# Patient Record
Sex: Male | Born: 1955
Health system: Southern US, Community
[De-identification: ages and names within clinical notes are randomized; demographics above are authoritative.]

## PROBLEM LIST (undated history)

## (undated) DIAGNOSIS — IMO0001 Reserved for inherently not codable concepts without codable children: Secondary | ICD-10-CM

## (undated) DIAGNOSIS — R5383 Other fatigue: Secondary | ICD-10-CM

## (undated) DIAGNOSIS — R0602 Shortness of breath: Secondary | ICD-10-CM

## (undated) DIAGNOSIS — I340 Nonrheumatic mitral (valve) insufficiency: Secondary | ICD-10-CM

## (undated) DIAGNOSIS — I071 Rheumatic tricuspid insufficiency: Secondary | ICD-10-CM

## (undated) DIAGNOSIS — I5189 Other ill-defined heart diseases: Secondary | ICD-10-CM

## (undated) DIAGNOSIS — I509 Heart failure, unspecified: Secondary | ICD-10-CM

## (undated) DIAGNOSIS — I10 Essential (primary) hypertension: Secondary | ICD-10-CM

## (undated) DIAGNOSIS — Z720 Tobacco use: Secondary | ICD-10-CM

## (undated) DIAGNOSIS — J189 Pneumonia, unspecified organism: Secondary | ICD-10-CM

## (undated) DIAGNOSIS — I272 Pulmonary hypertension, unspecified: Secondary | ICD-10-CM

## (undated) DIAGNOSIS — R600 Localized edema: Secondary | ICD-10-CM

## (undated) DIAGNOSIS — I5022 Chronic systolic (congestive) heart failure: Secondary | ICD-10-CM

## (undated) DIAGNOSIS — I519 Heart disease, unspecified: Secondary | ICD-10-CM

## (undated) DIAGNOSIS — F17200 Nicotine dependence, unspecified, uncomplicated: Secondary | ICD-10-CM

## (undated) HISTORY — PX: LEG SURGERY: SHX1003

---

## 1983-01-18 HISTORY — PX: OTHER SURGICAL HISTORY: SHX169

## 2014-12-18 DIAGNOSIS — I071 Rheumatic tricuspid insufficiency: Secondary | ICD-10-CM

## 2014-12-18 DIAGNOSIS — I340 Nonrheumatic mitral (valve) insufficiency: Secondary | ICD-10-CM

## 2014-12-18 DIAGNOSIS — I272 Pulmonary hypertension, unspecified: Secondary | ICD-10-CM

## 2014-12-18 DIAGNOSIS — I5022 Chronic systolic (congestive) heart failure: Secondary | ICD-10-CM

## 2014-12-18 HISTORY — DX: Rheumatic tricuspid insufficiency: I07.1

## 2014-12-18 HISTORY — DX: Chronic systolic (congestive) heart failure: I50.22

## 2014-12-18 HISTORY — DX: Pulmonary hypertension, unspecified: I27.20

## 2014-12-18 HISTORY — DX: Nonrheumatic mitral (valve) insufficiency: I34.0

## 2015-01-29 ENCOUNTER — Other Ambulatory Visit (HOSPITAL_COMMUNITY): Payer: Self-pay

## 2015-01-29 ENCOUNTER — Other Ambulatory Visit (HOSPITAL_COMMUNITY): Payer: Self-pay | Admitting: Cardiology

## 2015-01-29 ENCOUNTER — Encounter (HOSPITAL_COMMUNITY): Payer: Self-pay

## 2015-01-29 ENCOUNTER — Ambulatory Visit (HOSPITAL_COMMUNITY)
Admission: RE | Admit: 2015-01-29 | Discharge: 2015-01-29 | Disposition: A | Discharge status: Home / Self Care | Payer: 59 | Source: Ambulatory Visit | Attending: Internal Medicine | Admitting: Internal Medicine

## 2015-01-29 VITALS — BP 132/82 | HR 86 | Wt 146.5 lb

## 2015-01-29 DIAGNOSIS — F17210 Nicotine dependence, cigarettes, uncomplicated: Secondary | ICD-10-CM | POA: Insufficient documentation

## 2015-01-29 DIAGNOSIS — I110 Hypertensive heart disease with heart failure: Secondary | ICD-10-CM | POA: Insufficient documentation

## 2015-01-29 DIAGNOSIS — Z720 Tobacco use: Secondary | ICD-10-CM | POA: Diagnosis not present

## 2015-01-29 DIAGNOSIS — I5022 Chronic systolic (congestive) heart failure: Secondary | ICD-10-CM

## 2015-01-29 DIAGNOSIS — E785 Hyperlipidemia, unspecified: Secondary | ICD-10-CM | POA: Diagnosis not present

## 2015-01-29 DIAGNOSIS — I10 Essential (primary) hypertension: Secondary | ICD-10-CM | POA: Insufficient documentation

## 2015-01-29 DIAGNOSIS — Z79899 Other long term (current) drug therapy: Secondary | ICD-10-CM | POA: Insufficient documentation

## 2015-01-29 DIAGNOSIS — F17200 Nicotine dependence, unspecified, uncomplicated: Secondary | ICD-10-CM

## 2015-01-29 DIAGNOSIS — I429 Cardiomyopathy, unspecified: Secondary | ICD-10-CM | POA: Diagnosis present

## 2015-01-29 HISTORY — DX: Rheumatic tricuspid insufficiency: I07.1

## 2015-01-29 HISTORY — DX: Shortness of breath: R06.02

## 2015-01-29 HISTORY — DX: Reserved for inherently not codable concepts without codable children: IMO0001

## 2015-01-29 HISTORY — DX: Nonrheumatic mitral (valve) insufficiency: I34.0

## 2015-01-29 HISTORY — DX: Pneumonia, unspecified organism: J18.9

## 2015-01-29 HISTORY — DX: Other fatigue: R53.83

## 2015-01-29 HISTORY — DX: Localized edema: R60.0

## 2015-01-29 HISTORY — DX: Tobacco use: Z72.0

## 2015-01-29 HISTORY — DX: Pulmonary hypertension, unspecified: I27.20

## 2015-01-29 HISTORY — DX: Chronic systolic (congestive) heart failure: I50.22

## 2015-01-29 HISTORY — DX: Nicotine dependence, unspecified, uncomplicated: F17.200

## 2015-01-29 HISTORY — DX: Other ill-defined heart diseases: I51.89

## 2015-01-29 HISTORY — DX: Heart disease, unspecified: I51.9

## 2015-01-29 HISTORY — DX: Essential (primary) hypertension: I10

## 2015-01-29 MED ORDER — FUROSEMIDE 40 MG PO TABS: 40.0000 mg | ORAL_TABLET | Freq: Every day | ORAL | Status: DC

## 2015-01-29 MED ORDER — POTASSIUM CHLORIDE CRYS ER 20 MEQ PO TBCR: 20.0000 meq | EXTENDED_RELEASE_TABLET | Freq: Every day | ORAL | Status: DC

## 2015-01-29 MED ORDER — SPIRONOLACTONE 25 MG PO TABS: 12.5000 mg | ORAL_TABLET | Freq: Every day | ORAL | Status: DC

## 2015-01-29 NOTE — Progress Notes (Signed)
Patient ID: Carlos [name], male   DOB: 07/16/1955, 60 y.o.   MRN: [id number] PCP: Dr. Venetia Maxon [facility]) Cardiology: Dr. Aundra Dubin  60 yo with minimal past history presents for evaluation of newly-found cardiomyopathy.  Patient had minimal medical care up until December.  He had not seen a doctor for years.  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  Around Thanksgiving of 2016, he developed a cough and exertional shortness of breath.  He went to an urgent care and had a CXR that was suggestive of PNA, also showed cardiomegaly.  He did not improve with antibiotics.  Dyspnea became progressively worse and he developed ankle edema.  At this point, he went to see Dr Venetia Maxon in [place].  He was found to be hypertensive and was started on Coreg and losartan.  Atorvastatin was started for elevated lipids.  He was sent for an echo at the hospital in [place], [place] was 25-30%. He was therefore referred here.   Currently, patient is short of breath walking 3/10 of a mile to his mailbox.  A couple of mos ago, he was able to climb up [facility] without problems.  Now, any incline gets him out of breath.  No orthopnea/PND.  He has occasional dull right-sided chest pain but nothing exertional.  Poor appetite and early satiety.  He continues to smoke about 2 cigs/day.   He has noted increased fatigue since last summer.   ECG: NSR, nonspecific T wave flattening.   Labs (12/16): K 4.5, creatinine 1.21, LFTs normal  PMH: 1. Hyperlipidemia 2. HTN 3. Active smoker.  4. Chronic systolic CHF: Echo (Q000111Q) with EF 25-30%, restrictive diastolic function, mildly enlarged RV with mildly decreased systolic function, moderate MR, moderate TR, PASP 67 mmHg.   SH: Lives in [place] (near [place]), smokes < 1 ppd, no ETOH, drives garbage truck.   FH: No premature CAD, no cardiomyopathy.   ROS: All systems reviewed and negative  except as per HPI.   Current Outpatient Prescriptions  Medication Sig Dispense Refill  . atorvastatin (LIPITOR) 40 MG tablet Take 40 mg by mouth daily.    . carvedilol (COREG) 6.25 MG tablet Take 6.25 mg by mouth 2 (two) times daily with a meal.    . furosemide (LASIX) 40 MG tablet Take 1 tablet (40 mg total) by mouth daily. 30 tablet 6  . losartan (COZAAR) 100 MG tablet Take 100 mg by mouth daily.    . potassium chloride SA (KLOR-CON M20) 20 MEQ tablet Take 1 tablet (20 mEq total) by mouth daily. 30 tablet 6  . spironolactone (ALDACTONE) 25 MG tablet Take 0.5 tablets (12.5 mg total) by mouth daily. 15 tablet 6   No current facility-administered medications for this encounter.   BP 132/82 mmHg  Pulse 86  Wt 146 lb 8 oz (66.452 kg)  SpO2 99% General: NAD Neck: JVP 14-16 cm, no thyromegaly or thyroid nodule.  Lungs: Clear to auscultation bilaterally with normal respiratory effort. CV: Nondisplaced PMI.  Heart regular S1/S2, no S3/S4, 1/6 HSM LLSB/apex. 1+ edema 1/2 to knees bilaterally.  No carotid bruit.  Normal pedal pulses.  Abdomen: Soft, nontender, no hepatosplenomegaly, no distention.  Skin: Intact without lesions or rashes.  Neurologic: Alert and oriented x 3.  Psych: Normal affect. Extremities: No clubbing or cyanosis.  HEENT: Normal.   Assessment/Plan: 1. Chronic systolic CHF: Cardiomyopathy  of uncertain etiology, EF 25-30% on 12/16 echo.  ECG does not show definite prior MI.  However, he certainly has RFs for coronary disease (untreated HTN and hyperlipidemia, smoking).  Possible viral myocarditis: episode seemed to start with an infectious event (possible PNA).  Also, must consider hypertensive cardiomyopathy with possibly years of untreated hypertension.  However, he does not have much LVH on his ECG.  On exam today, he is very volume overloaded.  NYHA class III symptoms.   - He needs diuresis: will start Lasix 40 mg daily along with KCl 20 daily.  - Continue current Coreg  and losartan, eventually transition over to [place].  - I will add spironolactone 12.5 daily today.  - BMET/BNP today and repeat in 2 wks. - I will arrange for LHC/RHC to assess cardiac output and assess for CAD as a cause of his cardiomyopathy.  We discussed risks/benefits of the procedure and he agrees to go forward with it.  We will arrange for this next week . - If no significant coronary disease on cath, will arrange for cardiac MRI to assess for infiltrative disease.  - I recommended that he start ASA 81, however he is concerned about prior excessive bruising on ASA.  If he has significant coronary disease on cath, he will need to start ASA.  2. HTN: BP controlled on current regimen.  3. Hyperlipidemia: He recently started on atorvastatin.  4. Smoking: I strongly encouraged him to quit smoking.   [name] 01/29/2015

## 2015-01-29 NOTE — Patient Instructions (Signed)
START Lasix 40 mg tablet once daily.  START Potassium 20 meq tablet once daily.  START Spironolactone 12.5 mg (1/2 tablet) once daily.  You have been scheduled for a heart catheterization. Please see instruction sheet for additional details.  Follow up 3 weeks.  Do the following things EVERYDAY: 1) Weigh yourself in the morning before breakfast. Write it down and keep it in a log. 2) Take your medicines as prescribed 3) Eat low salt foods-Limit salt (sodium) to 2000 mg per day.  4) Stay as active as you can everyday 5) Limit all fluids for the day to less than 2 liters

## 2015-02-02 ENCOUNTER — Ambulatory Visit (HOSPITAL_COMMUNITY)
Admission: RE | Admit: 2015-02-02 | Discharge: 2015-02-02 | Disposition: A | Discharge status: Home / Self Care | Payer: 59 | Source: Ambulatory Visit | Attending: Cardiology | Admitting: Cardiology

## 2015-02-02 ENCOUNTER — Telehealth (HOSPITAL_COMMUNITY): Payer: Self-pay | Admitting: *Deleted

## 2015-02-02 ENCOUNTER — Encounter (HOSPITAL_COMMUNITY)
Admission: RE | Disposition: A | Discharge status: Home / Self Care | Payer: 59 | Source: Ambulatory Visit | Attending: Cardiology

## 2015-02-02 DIAGNOSIS — I110 Hypertensive heart disease with heart failure: Secondary | ICD-10-CM | POA: Insufficient documentation

## 2015-02-02 DIAGNOSIS — I5022 Chronic systolic (congestive) heart failure: Secondary | ICD-10-CM

## 2015-02-02 DIAGNOSIS — F17210 Nicotine dependence, cigarettes, uncomplicated: Secondary | ICD-10-CM | POA: Insufficient documentation

## 2015-02-02 DIAGNOSIS — I429 Cardiomyopathy, unspecified: Secondary | ICD-10-CM | POA: Insufficient documentation

## 2015-02-02 DIAGNOSIS — I272 Other secondary pulmonary hypertension: Secondary | ICD-10-CM | POA: Diagnosis not present

## 2015-02-02 DIAGNOSIS — E785 Hyperlipidemia, unspecified: Secondary | ICD-10-CM | POA: Insufficient documentation

## 2015-02-02 DIAGNOSIS — I2510 Atherosclerotic heart disease of native coronary artery without angina pectoris: Secondary | ICD-10-CM | POA: Diagnosis not present

## 2015-02-02 HISTORY — PX: CARDIAC CATHETERIZATION: SHX172

## 2015-02-02 LAB — POCT I-STAT 3, VENOUS BLOOD GAS (G3P V)
Acid-Base Excess: 3 mmol/L — ABNORMAL HIGH (ref 0.0–2.0)
BICARBONATE: 26.2 meq/L — AB (ref 20.0–24.0)
BICARBONATE: 28.5 meq/L — AB (ref 20.0–24.0)
O2 SAT: 61 %
O2 Saturation: 66 %
PCO2 VEN: 47.6 mmHg (ref 45.0–50.0)
PO2 VEN: 36 mmHg (ref 30.0–45.0)
TCO2: 28 mmol/L (ref 0–100)
TCO2: 30 mmol/L (ref 0–100)
pCO2, Ven: 46.1 mmHg (ref 45.0–50.0)
pH, Ven: 7.362 — ABNORMAL HIGH (ref 7.250–7.300)
pH, Ven: 7.385 — ABNORMAL HIGH (ref 7.250–7.300)
pO2, Ven: 33 mmHg (ref 30.0–45.0)

## 2015-02-02 LAB — BASIC METABOLIC PANEL
Anion gap: 8 (ref 5–15)
BUN: 15 mg/dL (ref 6–20)
CO2: 27 mmol/L (ref 22–32)
Calcium: 9.1 mg/dL (ref 8.9–10.3)
Chloride: 105 mmol/L (ref 101–111)
Creatinine, Ser: 1.13 mg/dL (ref 0.61–1.24)
GFR calc Af Amer: >60 mL/min (ref >60)
GLUCOSE: 112 mg/dL — AB (ref 65–99)
Potassium: 4.8 mmol/L (ref 3.5–5.1)
SODIUM: 140 mmol/L (ref 135–145)

## 2015-02-02 LAB — CBC
HEMATOCRIT: 42.3 % (ref 39.0–52.0)
HEMOGLOBIN: 13.8 g/dL (ref 13.0–17.0)
MCH: 28.6 pg (ref 26.0–34.0)
MCHC: 32.6 g/dL (ref 30.0–36.0)
MCV: 87.8 fL (ref 78.0–100.0)
Platelets: 257 10*3/uL (ref 150–400)
RBC: 4.82 MIL/uL (ref 4.22–5.81)
RDW: 14.9 % (ref 11.5–15.5)
WBC: 10.2 10*3/uL (ref 4.0–10.5)

## 2015-02-02 LAB — PROTIME-INR
INR: 1.16 (ref 0.00–1.49)
Prothrombin Time: 15 seconds (ref 11.6–15.2)

## 2015-02-02 SURGERY — RIGHT/LEFT HEART CATH AND CORONARY ANGIOGRAPHY

## 2015-02-02 MED ORDER — FENTANYL CITRATE (PF) 100 MCG/2ML IJ SOLN
INTRAMUSCULAR | Status: DC | PRN
  Administered 2015-02-02: 25 ug via INTRAVENOUS

## 2015-02-02 MED ORDER — SODIUM CHLORIDE 0.9 % IJ SOLN: 3.0000 mL | INTRAMUSCULAR | Status: DC | PRN

## 2015-02-02 MED ORDER — FENTANYL CITRATE (PF) 100 MCG/2ML IJ SOLN
INTRAMUSCULAR | Status: AC
  Filled 2015-02-02: qty 2

## 2015-02-02 MED ORDER — HEPARIN SODIUM (PORCINE) 1000 UNIT/ML IJ SOLN
INTRAMUSCULAR | Status: AC
  Filled 2015-02-02: qty 1

## 2015-02-02 MED ORDER — IOHEXOL 350 MG/ML SOLN
INTRAVENOUS | Status: DC | PRN
  Administered 2015-02-02: 70 mL via INTRAVENOUS

## 2015-02-02 MED ORDER — VERAPAMIL HCL 2.5 MG/ML IV SOLN
INTRAVENOUS | Status: AC
  Filled 2015-02-02: qty 2

## 2015-02-02 MED ORDER — VERAPAMIL HCL 2.5 MG/ML IV SOLN
INTRAVENOUS | Status: DC | PRN
  Administered 2015-02-02: 10 mL via INTRA_ARTERIAL

## 2015-02-02 MED ORDER — LIDOCAINE HCL (PF) 1 % IJ SOLN
INTRAMUSCULAR | Status: AC
  Filled 2015-02-02: qty 30

## 2015-02-02 MED ORDER — HEPARIN (PORCINE) IN NACL 2-0.9 UNIT/ML-% IJ SOLN
INTRAMUSCULAR | Status: AC
  Filled 2015-02-02: qty 1000

## 2015-02-02 MED ORDER — HEPARIN SODIUM (PORCINE) 1000 UNIT/ML IJ SOLN
INTRAMUSCULAR | Status: DC | PRN
  Administered 2015-02-02: 3000 [IU] via INTRAVENOUS

## 2015-02-02 MED ORDER — SODIUM CHLORIDE 0.9 % IV SOLN: 250.0000 mL | INTRAVENOUS | Status: DC | PRN

## 2015-02-02 MED ORDER — SODIUM CHLORIDE 0.9 % IJ SOLN: 3.0000 mL | Freq: Two times a day (BID) | INTRAMUSCULAR | Status: DC

## 2015-02-02 MED ORDER — ASPIRIN 81 MG PO CHEW: 81.0000 mg | CHEWABLE_TABLET | Freq: Once | ORAL | Status: DC

## 2015-02-02 MED ORDER — MIDAZOLAM HCL 2 MG/2ML IJ SOLN
INTRAMUSCULAR | Status: DC | PRN
  Administered 2015-02-02: 1 mg via INTRAVENOUS

## 2015-02-02 MED ORDER — MIDAZOLAM HCL 2 MG/2ML IJ SOLN
INTRAMUSCULAR | Status: AC
  Filled 2015-02-02: qty 2

## 2015-02-02 MED ORDER — SODIUM CHLORIDE 0.9 % IV SOLN
INTRAVENOUS | Status: DC
  Administered 2015-02-02: 07:00:00 via INTRAVENOUS

## 2015-02-02 MED ORDER — ASPIRIN 81 MG PO CHEW
CHEWABLE_TABLET | ORAL | Status: AC
  Administered 2015-02-02: 81 mg
  Filled 2015-02-02: qty 1

## 2015-02-02 SURGICAL SUPPLY — 13 items
CATH BALLN WEDGE 5F 110CM (CATHETERS) ×2 IMPLANT
CATH INFINITI 5 FR JL3.5 (CATHETERS) ×2 IMPLANT
CATH INFINITI 5FR ANG PIGTAIL (CATHETERS) ×2 IMPLANT
CATH INFINITI JR4 5F (CATHETERS) ×2 IMPLANT
DEVICE RAD COMP TR BAND LRG (VASCULAR PRODUCTS) ×2 IMPLANT
GLIDESHEATH SLEND SS 6F .021 (SHEATH) ×2 IMPLANT
KIT HEART LEFT (KITS) ×2 IMPLANT
PACK CARDIAC CATHETERIZATION (CUSTOM PROCEDURE TRAY) ×2 IMPLANT
SHEATH FAST CATH BRACH 5F 5CM (SHEATH) ×2 IMPLANT
SYR MEDRAD MARK V 150ML (SYRINGE) ×2 IMPLANT
TRANSDUCER W/STOPCOCK (MISCELLANEOUS) ×2 IMPLANT
TUBING CIL FLEX 10 FLL-RA (TUBING) ×2 IMPLANT
WIRE SAFE-T 1.5MM-J .035X260CM (WIRE) ×2 IMPLANT

## 2015-02-02 NOTE — H&P (View-Only) (Signed)
Patient ID: Carlos [name], male   DOB: [birth year], 60 y.o.   MRN: [id number] PCP: Dr. Venetia Maxon [facility]) Cardiology: Dr. Aundra Dubin  60 yo with minimal past history presents for evaluation of newly-found cardiomyopathy.  Patient had minimal medical care up until December.  He had not seen a doctor for years.  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  Around Thanksgiving of 2016, he developed a cough and exertional shortness of breath.  He went to an urgent care and had a CXR that was suggestive of PNA, also showed cardiomegaly.  He did not improve with antibiotics.  Dyspnea became progressively worse and he developed ankle edema.  At this point, he went to see Dr Venetia Maxon in [place].  He was found to be hypertensive and was started on Coreg and losartan.  Atorvastatin was started for elevated lipids.  He was sent for an echo at the hospital in [place], [place] was 25-30%. He was therefore referred here.   Currently, patient is short of breath walking 3/10 of a mile to his mailbox.  A couple of mos ago, he was able to climb up [facility] without problems.  Now, any incline gets him out of breath.  No orthopnea/PND.  He has occasional dull right-sided chest pain but nothing exertional.  Poor appetite and early satiety.  He continues to smoke about 2 cigs/day.   He has noted increased fatigue since last summer.   ECG: NSR, nonspecific T wave flattening.   Labs (12/16): K 4.5, creatinine 1.21, LFTs normal  PMH: 1. Hyperlipidemia 2. HTN 3. Active smoker.  4. Chronic systolic CHF: Echo (Q000111Q) with EF 25-30%, restrictive diastolic function, mildly enlarged RV with mildly decreased systolic function, moderate MR, moderate TR, PASP 67 mmHg.   SH: Lives in [place] (near [place]), smokes < 1 ppd, no ETOH, drives garbage truck.   FH: No premature CAD, no cardiomyopathy.   ROS: All systems reviewed and negative  except as per HPI.   Current Outpatient Prescriptions  Medication Sig Dispense Refill  . atorvastatin (LIPITOR) 40 MG tablet Take 40 mg by mouth daily.    . carvedilol (COREG) 6.25 MG tablet Take 6.25 mg by mouth 2 (two) times daily with a meal.    . furosemide (LASIX) 40 MG tablet Take 1 tablet (40 mg total) by mouth daily. 30 tablet 6  . losartan (COZAAR) 100 MG tablet Take 100 mg by mouth daily.    . potassium chloride SA (KLOR-CON M20) 20 MEQ tablet Take 1 tablet (20 mEq total) by mouth daily. 30 tablet 6  . spironolactone (ALDACTONE) 25 MG tablet Take 0.5 tablets (12.5 mg total) by mouth daily. 15 tablet 6   No current facility-administered medications for this encounter.   BP 132/82 mmHg  Pulse 86  Wt 146 lb 8 oz (66.452 kg)  SpO2 99% General: NAD Neck: JVP 14-16 cm, no thyromegaly or thyroid nodule.  Lungs: Clear to auscultation bilaterally with normal respiratory effort. CV: Nondisplaced PMI.  Heart regular S1/S2, no S3/S4, 1/6 HSM LLSB/apex. 1+ edema 1/2 to knees bilaterally.  No carotid bruit.  Normal pedal pulses.  Abdomen: Soft, nontender, no hepatosplenomegaly, no distention.  Skin: Intact without lesions or rashes.  Neurologic: Alert and oriented x 3.  Psych: Normal affect. Extremities: No clubbing or cyanosis.  HEENT: Normal.   Assessment/Plan: 1. Chronic systolic CHF: Cardiomyopathy  of uncertain etiology, EF 25-30% on 12/16 echo.  ECG does not show definite prior MI.  However, he certainly has RFs for coronary disease (untreated HTN and hyperlipidemia, smoking).  Possible viral myocarditis: episode seemed to start with an infectious event (possible PNA).  Also, must consider hypertensive cardiomyopathy with possibly years of untreated hypertension.  However, he does not have much LVH on his ECG.  On exam today, he is very volume overloaded.  NYHA class III symptoms.   - He needs diuresis: will start Lasix 40 mg daily along with KCl 20 daily.  - Continue current Coreg  and losartan, eventually transition over to [place].  - I will add spironolactone 12.5 daily today.  - BMET/BNP today and repeat in 2 wks. - I will arrange for LHC/RHC to assess cardiac output and assess for CAD as a cause of his cardiomyopathy.  We discussed risks/benefits of the procedure and he agrees to go forward with it.  We will arrange for this next week . - If no significant coronary disease on cath, will arrange for cardiac MRI to assess for infiltrative disease.  - I recommended that he start ASA 81, however he is concerned about prior excessive bruising on ASA.  If he has significant coronary disease on cath, he will need to start ASA.  2. HTN: BP controlled on current regimen.  3. Hyperlipidemia: He recently started on atorvastatin.  4. Smoking: I strongly encouraged him to quit smoking.   [name] 01/29/2015

## 2015-02-02 NOTE — Research (Signed)
CADLAD Informed Consent   Subject Name: Carlos [name]  Subject met inclusion and exclusion criteria.  The informed consent form, study requirements and expectations were reviewed with the subject and questions and concerns were addressed prior to the signing of the consent form.  The subject verbalized understanding of the trial requirements.  The subject agreed to participate in the CADLAD trial and signed the informed consent.  The informed consent was obtained prior to performance of any protocol-specific procedures for the subject.  A copy of the signed informed consent was given to the subject and a copy was placed in the subject's medical record.  Berneda Rose 02/02/2015, 9:00 AM

## 2015-02-02 NOTE — Progress Notes (Signed)
Patient Information: 1. Suspected CAD (No Prior PCI, No Prior CABG, and No Prior Angiogram Showing >= 50% Angiographic Stenosis) 2. No Prior Noninvasive Testing 3. Symptomatic 4. High Pretest Probability A (7) Indication: 10;  Carlos [name]

## 2015-02-02 NOTE — Interval H&P Note (Signed)
Cath Lab Visit (complete for each Cath Lab visit)  Clinical Evaluation Leading to the Procedure:   ACS: No.  Non-ACS:    Anginal Classification: CCS III  Anti-ischemic medical therapy: Minimal Therapy (1 class of medications)  Non-Invasive Test Results: No non-invasive testing performed  Prior CABG: No previous CABG      History and Physical Interval Note:  02/02/2015 9:15 AM  Carlos [name]  has presented today for surgery, with the diagnosis of hf  The various methods of treatment have been discussed with the patient and family. After consideration of risks, benefits and other options for treatment, the patient has consented to  Procedure(s): Right/Left Heart Cath and Coronary Angiography (N/A) as a surgical intervention .  The patient's history has been reviewed, patient examined, no change in status, stable for surgery.  I have reviewed the patient's chart and labs.  Questions were answered to the patient's satisfaction.     Japleen Tornow [employer]

## 2015-02-02 NOTE — Discharge Instructions (Signed)
Radial Site Care °Refer to this sheet in the next few weeks. These instructions provide you with information about caring for yourself after your procedure. Your health care provider may also give you more specific instructions. Your treatment has been planned according to current medical practices, but problems sometimes occur. Call your health care provider if you have any problems or questions after your procedure. °WHAT TO EXPECT AFTER THE PROCEDURE °After your procedure, it is typical to have the following: °· Bruising at the radial site that usually fades within 1-2 weeks. °· Blood collecting in the tissue (hematoma) that may be painful to the touch. It should usually decrease in size and tenderness within 1-2 weeks. °HOME CARE INSTRUCTIONS °· Take medicines only as directed by your health care provider. °· You may shower 24-48 hours after the procedure or as directed by your health care provider. Remove the bandage (dressing) and gently wash the site with plain soap and water. Pat the area dry with a clean towel. Do not rub the site, because this may cause bleeding. °· Do not take baths, swim, or use a hot tub until your health care provider approves. °· Check your insertion site every day for redness, swelling, or drainage. °· Do not apply powder or lotion to the site. °· Do not flex or bend the affected arm for 24 hours or as directed by your health care provider. °· Do not push or pull heavy objects with the affected arm for 24 hours or as directed by your health care provider. °· Do not lift over 10 lb (4.5 kg) for 5 days after your procedure or as directed by your health care provider. °· Ask your health care provider when it is okay to: °¨ Return to work or school. °¨ Resume usual physical activities or sports. °¨ Resume sexual activity. °· Do not drive home if you are discharged the same day as the procedure. Have someone else drive you. °· You may drive 24 hours after the procedure unless otherwise  instructed by your health care provider. °· Do not operate machinery or power tools for 24 hours after the procedure. °· If your procedure was done as an outpatient procedure, which means that you went home the same day as your procedure, a responsible adult should be with you for the first 24 hours after you arrive home. °· Keep all follow-up visits as directed by your health care provider. This is important. °SEEK MEDICAL CARE IF: °· You have a fever. °· You have chills. °· You have increased bleeding from the radial site. Hold pressure on the site. °SEEK IMMEDIATE MEDICAL CARE IF: °· You have unusual pain at the radial site. °· You have redness, warmth, or swelling at the radial site. °· You have drainage (other than a small amount of blood on the dressing) from the radial site. °· The radial site is bleeding, and the bleeding does not stop after 30 minutes of holding steady pressure on the site. °· Your arm or hand becomes pale, cool, tingly, or numb. °  °This information is not intended to replace advice given to you by your health care provider. Make sure you discuss any questions you have with your health care provider. °  °Document Released: 02/05/2010 Document Revised: 01/24/2014 Document Reviewed: 07/22/2013 °Elsevier Interactive Patient Education ©2016 Elsevier Inc[employer] ° °

## 2015-02-02 NOTE — Telephone Encounter (Signed)
Left and right heart cath precert approval [id number]

## 2015-02-03 ENCOUNTER — Encounter (HOSPITAL_COMMUNITY): Payer: Self-pay | Admitting: Cardiology

## 2015-02-03 ENCOUNTER — Telehealth (HOSPITAL_COMMUNITY): Payer: Self-pay | Admitting: *Deleted

## 2015-02-03 MED FILL — Heparin Sodium (Porcine) 2 Unit/ML in Sodium Chloride 0.9%: INTRAMUSCULAR | Qty: 500 | Status: AC

## 2015-02-03 MED FILL — Lidocaine HCl Local Preservative Free (PF) Inj 1%: INTRAMUSCULAR | Qty: 30 | Status: AC

## 2015-02-03 NOTE — Telephone Encounter (Signed)
OV note and cath report faxed to Dr Venetia Maxon at [phone]

## 2015-02-23 ENCOUNTER — Encounter (HOSPITAL_COMMUNITY): Payer: Self-pay

## 2015-02-23 ENCOUNTER — Other Ambulatory Visit (HOSPITAL_COMMUNITY): Payer: Self-pay

## 2015-02-23 ENCOUNTER — Ambulatory Visit (HOSPITAL_COMMUNITY)
Admission: RE | Admit: 2015-02-23 | Discharge: 2015-02-23 | Disposition: A | Discharge status: Home / Self Care | Payer: 59 | Source: Ambulatory Visit | Attending: Cardiology | Admitting: Cardiology

## 2015-02-23 VITALS — BP 104/50 | HR 80 | Resp 18 | Wt 142.8 lb

## 2015-02-23 DIAGNOSIS — F17210 Nicotine dependence, cigarettes, uncomplicated: Secondary | ICD-10-CM | POA: Diagnosis not present

## 2015-02-23 DIAGNOSIS — I5022 Chronic systolic (congestive) heart failure: Secondary | ICD-10-CM | POA: Diagnosis not present

## 2015-02-23 DIAGNOSIS — E785 Hyperlipidemia, unspecified: Secondary | ICD-10-CM | POA: Insufficient documentation

## 2015-02-23 DIAGNOSIS — I428 Other cardiomyopathies: Secondary | ICD-10-CM | POA: Diagnosis not present

## 2015-02-23 DIAGNOSIS — I2510 Atherosclerotic heart disease of native coronary artery without angina pectoris: Secondary | ICD-10-CM | POA: Diagnosis not present

## 2015-02-23 DIAGNOSIS — Z79899 Other long term (current) drug therapy: Secondary | ICD-10-CM | POA: Insufficient documentation

## 2015-02-23 DIAGNOSIS — F17200 Nicotine dependence, unspecified, uncomplicated: Secondary | ICD-10-CM

## 2015-02-23 DIAGNOSIS — I110 Hypertensive heart disease with heart failure: Secondary | ICD-10-CM | POA: Diagnosis not present

## 2015-02-23 DIAGNOSIS — Z720 Tobacco use: Secondary | ICD-10-CM

## 2015-02-23 LAB — BASIC METABOLIC PANEL
Anion gap: 11 (ref 5–15)
BUN: 24 mg/dL — AB (ref 6–20)
CHLORIDE: 105 mmol/L (ref 101–111)
CO2: 23 mmol/L (ref 22–32)
CREATININE: 1.59 mg/dL — AB (ref 0.61–1.24)
Calcium: 9.6 mg/dL (ref 8.9–10.3)
GFR calc Af Amer: 54 mL/min — ABNORMAL LOW (ref >60)
GFR calc non Af Amer: 47 mL/min — ABNORMAL LOW (ref >60)
GLUCOSE: 98 mg/dL (ref 65–99)
POTASSIUM: 5.1 mmol/L (ref 3.5–5.1)
Sodium: 139 mmol/L (ref 135–145)

## 2015-02-23 MED ORDER — SACUBITRIL-VALSARTAN 24-26 MG PO TABS: 1.0000 | ORAL_TABLET | Freq: Two times a day (BID) | ORAL | Status: DC

## 2015-02-23 MED ORDER — SPIRONOLACTONE 25 MG PO TABS: 12.5000 mg | ORAL_TABLET | Freq: Every day | ORAL | Status: DC

## 2015-02-23 MED ORDER — FUROSEMIDE 40 MG PO TABS: 40.0000 mg | ORAL_TABLET | Freq: Every day | ORAL | Status: DC

## 2015-02-23 NOTE — Progress Notes (Signed)
Patient ID: Carlos [name], male   DOB: 15-Jan-1956, 60 y.o.   MRN: [id number]    Advanced Heart Failure Clinic Note   PCP: Dr. Venetia Maxon [facility]) Cardiology: Dr. Aundra Dubin  60 yo with minimal past history presents for evaluation of newly-found cardiomyopathy.  Patient had minimal medical care up until December.  He had not seen a doctor for years.  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  Around Thanksgiving of 2016, he developed a cough and exertional shortness of breath.  He went to an urgent care and had a CXR that was suggestive of PNA, also showed cardiomegaly.  He did not improve with antibiotics.  Dyspnea became progressively worse and he developed ankle edema.  At this point, he went to see Dr Venetia Maxon in [place].  He was found to be hypertensive and was started on Coreg and losartan.  Atorvastatin was started for elevated lipids.  He was sent for an echo at the hospital in [place], [place] was 25-30%. He was therefore referred here.  Right and left heart cath showed minimal CAD, reasonably optimized filling pressures on Lasix.  Cardiac index was low but not markedly so.   He returns today for HF follow up. Overall feels better.  Can now walk about a mile (to mailbox and back) without SOB, previously had to rest once he got there. Better on inclines with SOB, but still getting fatigue and soreness in his legs. No orthopnea/PND. Only occasionally dizzy, with very fast movements or rapid standing, not marked or limiting.  Still has occasional dull right-sided chest pain but not related to activity. Appetite has improved as well.  Still smoking abour 3-4 cigs/day.      Labs (12/16): K 4.5, creatinine 1.21, LFTs normal Labs (1/17): K 4.8, creatinine 1.13  PMH: 1. Hyperlipidemia 2. HTN 3. Active smoker.  4. Chronic systolic CHF: Nonischemic cardiomyopathy.  - Echo (12/16) with EF 25-30%, restrictive  diastolic function, mildly enlarged RV with mildly decreased systolic function, moderate MR, moderate TR, PASP 67 mmHg.  - LHC/RHC (1/17): Mild, nonobstructive CAD; mean RA 7, PA 42/16 mean 26, mean PCWP 17, CI 2.14, PVR 2.52 WU.  SH: Lives in [place] (near [place]), smokes < 1 ppd, no ETOH, drives garbage truck.   FH: No premature CAD, no cardiomyopathy.   ROS: All systems reviewed and negative except as per HPI.   Current Outpatient Prescriptions  Medication Sig Dispense Refill  . atorvastatin (LIPITOR) 80 MG tablet Take 40 mg by mouth daily.    . carvedilol (COREG) 6.25 MG tablet Take 6.25 mg by mouth 2 (two) times daily with a meal.    . furosemide (LASIX) 40 MG tablet Take 1 tablet (40 mg total) by mouth daily. 30 tablet 6  . losartan (COZAAR) 100 MG tablet Take 100 mg by mouth daily.    . potassium chloride SA (KLOR-CON M20) 20 MEQ tablet Take 1 tablet (20 mEq total) by mouth daily. 30 tablet 6  . spironolactone (ALDACTONE) 25 MG tablet Take 0.5 tablets (12.5 mg total) by mouth daily. 15 tablet 6   No current facility-administered medications for this encounter.   BP 104/50 mmHg  Pulse 80  Resp 18  Wt 142 lb 12 oz (64.751 kg)  SpO2 96%   Wt Readings from Last 3 Encounters:  02/23/15 142 lb 12 oz (64.751 kg)  02/02/15  136 lb (61.689 kg)  01/29/15 146 lb 8 oz (66.452 kg)   General: NAD Neck: JVP 7 cm, no thyromegaly or thyroid nodule.  Lungs: Clear to auscultation bilaterally with normal respiratory effort. CV: Lateral PMI.  Heart regular S1/S2, no S3/S4, 1/6 HSM LLSB/apex. No edema.  No carotid bruit.  Normal pedal pulses.  Abdomen: Soft, nontender, no hepatosplenomegaly, no distention.  Skin: Intact without lesions or rashes.  Neurologic: Alert and oriented x 3.  Psych: Normal affect. Extremities: No clubbing or cyanosis.  HEENT: Normal.   Assessment/Plan: 1. Chronic systolic CHF: Nonischemic cardiomyopathy, EF 25-30% on 12/16 echo.  LHC/RHC (1/17) showed mild  nonobstructive CAD and near-normal filling pressures on Lasix.  Possible viral myocarditis: episode seemed to start with an infectious event (possible PNA).  Also, must consider hypertensive cardiomyopathy with possibly years of untreated hypertension.  However, he does not have much LVH on his ECG.  Not a heavy drinker.  Volume status much improved.  NYHA class II symptoms.   - Continue Lasix 40 mg daily along with KCl 20 daily.  - Continue current Coreg  - Stop losartan, Transition over to [facility] 24/26. BMET today and x 2 weeks. - continue spironolactone 12.5 daily today.  - Working on arranging  cardiac MRI to assess for infiltrative disease, need to appeal with insurance.  - Holding off on ASA 81 with prior excessive bruising on ASA.   - Echo for ICD assessment in 5/17.  Narrow QRS so would not be CRT candidate.  2. HTN: BP controlled, even soft,  on current regimen.  3. Hyperlipidemia: Continue atorvastatin 40 mg daily.  4. Smoking:  Needs to quit smoking, discussed with him today.   Switch to [facility] as above. BMET today and x 2 weeks.  Satira Mccallum Tillery PA-C 02/23/2015   Patient seen with PA, agree with the above note.  Nonischemic cardiomyopathy based on cath, myocarditis versus hypertensive CMP.  He looks near-euvolemic today.  Symptomatically improved, NYHA class II.  I will transition him to [facility] 24/26 bid and stop losartan.  BMET/BNP today and repeat BMET in 2 wks.   [name] 02/23/2015

## 2015-02-23 NOTE — Patient Instructions (Addendum)
STOP Losartan.  START Entresto 24/26 - 1 tablet twice daily starting Wednesday.  Routine lab work today. Will notify you of abnormal results, otherwise no news is good news!  Give your primary physician Rx for lab draws in 10-14 days.  May return to work Tuesday February 14th.  Follow up 1 month.  Do the following things EVERYDAY: 1) Weigh yourself in the morning before breakfast. Write it down and keep it in a log. 2) Take your medicines as prescribed 3) Eat low salt foods-Limit salt (sodium) to 2000 mg per day.  4) Stay as active as you can everyday 5) Limit all fluids for the day to less than 2 liters

## 2015-02-23 NOTE — Progress Notes (Signed)
[employer] disability forms completed and faxed to provided # [phone]. Copy of forms scanned into patient's electronic medical record. Incident # [id number] Patient made aware during today's appointment in CHF clinic.  Renee Pain

## 2015-02-24 ENCOUNTER — Other Ambulatory Visit (HOSPITAL_COMMUNITY): Payer: Self-pay | Admitting: *Deleted

## 2015-02-24 MED ORDER — FUROSEMIDE 40 MG PO TABS: 20.0000 mg | ORAL_TABLET | Freq: Every day | ORAL | Status: DC

## 2015-02-26 ENCOUNTER — Telehealth (HOSPITAL_COMMUNITY): Payer: Self-pay | Admitting: Pharmacist

## 2015-02-26 ENCOUNTER — Telehealth (HOSPITAL_COMMUNITY): Payer: Self-pay | Admitting: *Deleted

## 2015-02-26 NOTE — Telephone Encounter (Signed)
Entresto 24-26 mg PA approved by [facility] through 02/24/16. Patient may use coupon cards to assist with copay costs.   Ruta Hinds. Velva Harman, PharmD, BCPS, CPP Clinical Pharmacist Pager: [phone] Phone: [phone] 02/26/2015 2:32 PM

## 2015-02-26 NOTE — Telephone Encounter (Signed)
Lab order faxed to [facility] for bmet per Dr.Mclean expected. 03/02/15

## 2015-03-02 ENCOUNTER — Telehealth (HOSPITAL_COMMUNITY): Payer: Self-pay

## 2015-03-02 ENCOUNTER — Encounter (HOSPITAL_COMMUNITY): Payer: Self-pay

## 2015-03-02 NOTE — Telephone Encounter (Signed)
Patient called to get modified return to work letter. Needs to exclude "light duty" but with same restrictions per work. Will fax modified letter to [employer] to provided # [phone] Wife aware and will make sure letter gets through fax.  Renee Pain

## 2015-03-09 ENCOUNTER — Encounter (HOSPITAL_COMMUNITY): Payer: Self-pay

## 2015-03-09 NOTE — Progress Notes (Signed)
Disability paperwork with supportive documentation completed, signed, and faxed by Dr. [name] to [facility] at provided # [phone] Incident # [id number] Copy of forms scanned into patient's electronic medical record.  Carlos [name]

## 2015-03-09 NOTE — Progress Notes (Signed)
FMLA paperwork for [name] on behalf of patient completed signed, and faxed by Dr. [name] with supportive documentation to provided # [phone] Copy of paperwork scanned into patient's electronic medical record.  Carlos [name]

## 2015-03-13 ENCOUNTER — Other Ambulatory Visit (HOSPITAL_COMMUNITY): Payer: Self-pay | Admitting: Cardiology

## 2015-03-23 ENCOUNTER — Encounter (HOSPITAL_COMMUNITY): Payer: Self-pay | Admitting: *Deleted

## 2015-03-23 ENCOUNTER — Ambulatory Visit (HOSPITAL_COMMUNITY)
Admission: RE | Admit: 2015-03-23 | Discharge: 2015-03-23 | Disposition: A | Discharge status: Home / Self Care | Payer: 59 | Source: Ambulatory Visit | Attending: Cardiology | Admitting: Cardiology

## 2015-03-23 ENCOUNTER — Encounter (HOSPITAL_COMMUNITY): Payer: Self-pay

## 2015-03-23 VITALS — BP 110/60 | HR 69 | Wt 155.5 lb

## 2015-03-23 DIAGNOSIS — J392 Other diseases of pharynx: Secondary | ICD-10-CM | POA: Insufficient documentation

## 2015-03-23 DIAGNOSIS — I110 Hypertensive heart disease with heart failure: Secondary | ICD-10-CM | POA: Insufficient documentation

## 2015-03-23 DIAGNOSIS — E785 Hyperlipidemia, unspecified: Secondary | ICD-10-CM | POA: Insufficient documentation

## 2015-03-23 DIAGNOSIS — Z79899 Other long term (current) drug therapy: Secondary | ICD-10-CM | POA: Diagnosis not present

## 2015-03-23 DIAGNOSIS — I5022 Chronic systolic (congestive) heart failure: Secondary | ICD-10-CM

## 2015-03-23 DIAGNOSIS — I428 Other cardiomyopathies: Secondary | ICD-10-CM | POA: Diagnosis not present

## 2015-03-23 DIAGNOSIS — I10 Essential (primary) hypertension: Secondary | ICD-10-CM

## 2015-03-23 DIAGNOSIS — I2510 Atherosclerotic heart disease of native coronary artery without angina pectoris: Secondary | ICD-10-CM | POA: Insufficient documentation

## 2015-03-23 DIAGNOSIS — Z87891 Personal history of nicotine dependence: Secondary | ICD-10-CM | POA: Insufficient documentation

## 2015-03-23 LAB — BASIC METABOLIC PANEL
Anion gap: 10 (ref 5–15)
BUN: 19 mg/dL (ref 6–20)
CHLORIDE: 106 mmol/L (ref 101–111)
CO2: 24 mmol/L (ref 22–32)
CREATININE: 1.33 mg/dL — AB (ref 0.61–1.24)
Calcium: 9.2 mg/dL (ref 8.9–10.3)
GFR calc Af Amer: >60 mL/min (ref >60)
GFR, EST NON AFRICAN AMERICAN: 58 mL/min — AB (ref >60)
GLUCOSE: 109 mg/dL — AB (ref 65–99)
Potassium: 4.6 mmol/L (ref 3.5–5.1)
SODIUM: 140 mmol/L (ref 135–145)

## 2015-03-23 MED ORDER — CARVEDILOL 12.5 MG PO TABS: 12.5000 mg | ORAL_TABLET | Freq: Two times a day (BID) | ORAL | Status: DC

## 2015-03-23 NOTE — Progress Notes (Signed)
Advanced Heart Failure Medication Review by a Pharmacist  Does the patient  feel that his/her medications are working for him/her?  yes  Has the patient been experiencing any side effects to the medications prescribed?  no  Does the patient measure his/her own blood pressure or blood glucose at home?  yes   Does the patient have any problems obtaining medications due to transportation or finances?   no  Understanding of regimen: good Understanding of indications: good Potential of compliance: good Patient understands to avoid NSAIDs. Patient understands to avoid decongestants.  Issues to address at subsequent visits: None   Pharmacist comments: 60 YO pleasant male presenting with his wife.  Pt reports good adherence to his medication regimen and reports no current side effects or problems obtaining his medications. Pt had questions if his entresto PA was approved by [employer]. Instructed pt that PA was approved and pt may use coupon cards to assist with copay costs.     Time with patient: 5 mins Preparation and documentation time: 5 min Total time: 10 min

## 2015-03-23 NOTE — Progress Notes (Signed)
Patient ID: Carlos [name], male   DOB: March 19, 1955, 60 y.o.   MRN: [id number]    Advanced Heart Failure Clinic Note   PCP: Dr. Venetia Maxon [facility]) Cardiology: Dr. Aundra Dubin  60 yo with minimal past history presents for evaluation of newly-found cardiomyopathy.  Patient had minimal medical care up until December.  He had not seen a doctor for years.  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  Around Thanksgiving of 2016, he developed a cough and exertional shortness of breath.  He went to an urgent care and had a CXR that was suggestive of PNA, also showed cardiomegaly.  He did not improve with antibiotics.  Dyspnea became progressively worse and he developed ankle edema.  At this point, he went to see Dr Venetia Maxon in [place].  He was found to be hypertensive and was started on Coreg and losartan.  Atorvastatin was started for elevated lipids.  He was sent for an echo at the hospital in [place], [place] was 25-30%. He was therefore referred here.  Right and left heart cath showed minimal CAD, reasonably optimized filling pressures on Lasix.  Cardiac index was low but not markedly so.   He returns today for HF follow up. He has quit smoking.  Unfortunately, he was recently found to have a throat mass, suspect cancer.  This is going to be evaluated at [facility].  Otherwise, he has been doing well. No longer getting exertional dyspnea.  Able to go camping and hiking.  Wants to know if he can go back to work.  Weight is up, but wife says that his appetite has been much better and he has been eating more.  Recently decreased Lasix and spironolactone with elevated K and creatinine.     Labs (12/16): K 4.5, creatinine 1.21, LFTs normal Labs (1/17): K 4.8, creatinine 1.13 Labs (2/17): K 5.1 => 4.4, creatinine 1.69 => 1.1  PMH: 1. Hyperlipidemia 2. HTN 3. Active smoker.  4. Chronic systolic CHF: Nonischemic  cardiomyopathy.  - Echo (12/16) with EF 25-30%, restrictive diastolic function, mildly enlarged RV with mildly decreased systolic function, moderate MR, moderate TR, PASP 67 mmHg.  - LHC/RHC (1/17): Mild, nonobstructive CAD; mean RA 7, PA 42/16 mean 26, mean PCWP 17, CI 2.14, PVR 2.52 WU. 5. Throat cancer  SH: Lives in [place] (near [place]), quit smoking 2/17, no ETOH, drives garbage truck.   FH: No premature CAD, no cardiomyopathy.   ROS: All systems reviewed and negative except as per HPI.   Current Outpatient Prescriptions  Medication Sig Dispense Refill  . atorvastatin (LIPITOR) 80 MG tablet Take 40 mg by mouth daily.    . carvedilol (COREG) 12.5 MG tablet Take 1 tablet (12.5 mg total) by mouth 2 (two) times daily with a meal. 60 tablet 3  . furosemide (LASIX) 40 MG tablet Take 0.5 tablets (20 mg total) by mouth daily. 45 tablet 3  . sacubitril-valsartan (ENTRESTO) 24-26 MG Take 1 tablet by mouth 2 (two) times daily. 60 tablet 3  . spironolactone (ALDACTONE) 25 MG tablet Take 0.5 tablets (12.5 mg total) by mouth daily. 45 tablet 3   No current facility-administered medications for this encounter.   BP 110/60 mmHg  Pulse 69  Wt 155 lb 8 oz (70.534 kg)  SpO2 98%   Wt Readings from Last 3 Encounters:  03/23/15 155 lb 8 oz (70.534  kg)  02/23/15 142 lb 12 oz (64.751 kg)  02/02/15 136 lb (61.689 kg)   General: NAD Neck: JVP 7 cm, no thyromegaly or thyroid nodule.  Lungs: Clear to auscultation bilaterally with normal respiratory effort. CV: Lateral PMI.  Heart regular S1/S2, no S3/S4, no murmur. No edema.  No carotid bruit.  Normal pedal pulses.  Abdomen: Soft, nontender, no hepatosplenomegaly, no distention.  Skin: Intact without lesions or rashes.  Neurologic: Alert and oriented x 3.  Psych: Normal affect. Extremities: No clubbing or cyanosis.  HEENT: Normal.   Assessment/Plan: 1. Chronic systolic CHF: Nonischemic cardiomyopathy, EF 25-30% on 12/16 echo.  LHC/RHC (1/17)  showed mild nonobstructive CAD and near-normal filling pressures on Lasix.  Possible viral myocarditis: episode seemed to start with an infectious event (possible PNA).  Also, must consider hypertensive cardiomyopathy with possibly years of untreated hypertension.  However, he does not have much LVH on his ECG.  Not a heavy drinker.  Volume status much improved.  NYHA class II symptoms.   - Continue Lasix 20 mg daily, BMET today.   - Increase Coreg to 9.375 mg bid x 4 days then 12.5 mg bid after that. - Continue Entresto and spironolactone at current doses. - Holding off on ASA 81 with prior excessive bruising on ASA.   - Cardiac MRI for ICD assessment in 5/17.  Narrow QRS so would not be CRT candidate.  2. HTN: BP controlled,on current regimen.  3. Hyperlipidemia: Continue atorvastatin 40 mg daily.  4. Smoking:  He has now quit, but concern for throat cancer.   Followup in 1 month.  [name] 03/23/2015

## 2015-03-23 NOTE — Patient Instructions (Addendum)
Take Carvedilol 9.375 mg (1 &1/2 tablets) for 4 days.  Then START Carvedilol 12.5mg  twice daily.  Routine lab work today. Will notify you of abnormal results  Follow up with Dr.McLean in 1 month.

## 2015-03-24 ENCOUNTER — Other Ambulatory Visit (HOSPITAL_COMMUNITY): Payer: Self-pay | Admitting: *Deleted

## 2015-03-24 MED ORDER — SPIRONOLACTONE 25 MG PO TABS: 12.5000 mg | ORAL_TABLET | Freq: Every day | ORAL | Status: DC

## 2015-03-31 ENCOUNTER — Telehealth (HOSPITAL_COMMUNITY): Payer: Self-pay | Admitting: *Deleted

## 2015-03-31 NOTE — Telephone Encounter (Signed)
I cannot get through to their office, have been on hold 10 minutes now on their physician's line.  Can you give whoever called my cell phone number and ask them to have Dr Nicolette Bang call me? Thanks.

## 2015-03-31 NOTE — Telephone Encounter (Signed)
Pt has just been diagnosed w/epiglotic cancer and needs surgery, approx last 4-6 hours w/general anesthesia and they want to know if pt is ok to proceed.  Dr Aundra Dubin can call Dr Nicolette Bang at [phone] to discuss

## 2015-03-31 NOTE — Telephone Encounter (Signed)
Left Tamika a VM w/Dr McLean's cell number

## 2015-04-13 ENCOUNTER — Telehealth (HOSPITAL_COMMUNITY): Payer: Self-pay | Admitting: *Deleted

## 2015-04-13 NOTE — Telephone Encounter (Signed)
Received call from [place] at [place], they need copy of pt's last EKG, cath, labs and OV note, records faxed to her at [phone]

## 2015-04-23 ENCOUNTER — Encounter (HOSPITAL_COMMUNITY): Payer: 59

## 2015-05-26 ENCOUNTER — Other Ambulatory Visit (HOSPITAL_COMMUNITY): Payer: Self-pay | Admitting: *Deleted

## 2015-05-26 MED ORDER — ATORVASTATIN CALCIUM 80 MG PO TABS: 40.0000 mg | ORAL_TABLET | Freq: Every day | ORAL | Status: DC

## 2015-05-26 MED ORDER — CARVEDILOL 12.5 MG PO TABS: 12.5000 mg | ORAL_TABLET | Freq: Two times a day (BID) | ORAL | Status: DC

## 2015-05-26 MED ORDER — SPIRONOLACTONE 25 MG PO TABS: 12.5000 mg | ORAL_TABLET | Freq: Every day | ORAL | Status: DC

## 2015-05-26 MED ORDER — FUROSEMIDE 40 MG PO TABS: 20.0000 mg | ORAL_TABLET | Freq: Every day | ORAL | Status: DC

## 2015-05-26 MED ORDER — SACUBITRIL-VALSARTAN 24-26 MG PO TABS: 1.0000 | ORAL_TABLET | Freq: Two times a day (BID) | ORAL | Status: DC

## 2015-06-22 ENCOUNTER — Telehealth (HOSPITAL_COMMUNITY): Payer: Self-pay

## 2015-06-22 ENCOUNTER — Other Ambulatory Visit (HOSPITAL_COMMUNITY): Payer: Self-pay

## 2015-06-22 MED ORDER — SACUBITRIL-VALSARTAN 24-26 MG PO TABS: 1.0000 | ORAL_TABLET | Freq: Two times a day (BID) | ORAL | Status: DC

## 2015-06-22 NOTE — Telephone Encounter (Signed)
Requesting entresto refills. Rx sent for 30 days to preferred pharmacy electronically. Wife states patient "doing great and has an apt at end of month". Advised to call with any further questions/concerns.  Renee Pain

## 2015-07-13 ENCOUNTER — Encounter (HOSPITAL_COMMUNITY): Payer: Self-pay

## 2015-07-13 ENCOUNTER — Ambulatory Visit (HOSPITAL_COMMUNITY)
Admission: RE | Admit: 2015-07-13 | Discharge: 2015-07-13 | Disposition: A | Discharge status: Home / Self Care | Payer: 59 | Source: Ambulatory Visit | Attending: Cardiology | Admitting: Cardiology

## 2015-07-13 ENCOUNTER — Telehealth: Payer: Self-pay | Admitting: *Deleted

## 2015-07-13 VITALS — BP 140/70 | HR 61 | Ht 64.0 in | Wt 164.0 lb

## 2015-07-13 DIAGNOSIS — Z85819 Personal history of malignant neoplasm of unspecified site of lip, oral cavity, and pharynx: Secondary | ICD-10-CM | POA: Diagnosis not present

## 2015-07-13 DIAGNOSIS — Z87891 Personal history of nicotine dependence: Secondary | ICD-10-CM | POA: Diagnosis not present

## 2015-07-13 DIAGNOSIS — I429 Cardiomyopathy, unspecified: Secondary | ICD-10-CM | POA: Insufficient documentation

## 2015-07-13 DIAGNOSIS — Z79899 Other long term (current) drug therapy: Secondary | ICD-10-CM | POA: Insufficient documentation

## 2015-07-13 DIAGNOSIS — R0989 Other specified symptoms and signs involving the circulatory and respiratory systems: Secondary | ICD-10-CM | POA: Diagnosis not present

## 2015-07-13 DIAGNOSIS — I2510 Atherosclerotic heart disease of native coronary artery without angina pectoris: Secondary | ICD-10-CM | POA: Insufficient documentation

## 2015-07-13 DIAGNOSIS — E785 Hyperlipidemia, unspecified: Secondary | ICD-10-CM | POA: Diagnosis not present

## 2015-07-13 DIAGNOSIS — I110 Hypertensive heart disease with heart failure: Secondary | ICD-10-CM | POA: Diagnosis not present

## 2015-07-13 DIAGNOSIS — I5022 Chronic systolic (congestive) heart failure: Secondary | ICD-10-CM

## 2015-07-13 LAB — BASIC METABOLIC PANEL
ANION GAP: 8 (ref 5–15)
BUN: 15 mg/dL (ref 6–20)
CALCIUM: 9.6 mg/dL (ref 8.9–10.3)
CO2: 26 mmol/L (ref 22–32)
Chloride: 107 mmol/L (ref 101–111)
Creatinine, Ser: 1.27 mg/dL — ABNORMAL HIGH (ref 0.61–1.24)
Glucose, Bld: 99 mg/dL (ref 65–99)
Potassium: 4.5 mmol/L (ref 3.5–5.1)
SODIUM: 141 mmol/L (ref 135–145)

## 2015-07-13 MED ORDER — SACUBITRIL-VALSARTAN 49-51 MG PO TABS: 1.0000 | ORAL_TABLET | Freq: Two times a day (BID) | ORAL | Status: DC

## 2015-07-13 NOTE — Progress Notes (Signed)
Advanced Heart Failure Medication Review by a Pharmacist  Does the patient  feel that his/her medications are working for him/her?  yes  Has the patient been experiencing any side effects to the medications prescribed?  no  Does the patient measure his/her own blood pressure or blood glucose at home?  no   Does the patient have any problems obtaining medications due to transportation or finances?   no  Understanding of regimen: good Understanding of indications: good Potential of compliance: good Patient understands to avoid NSAIDs. Patient understands to avoid decongestants.  Issues to address at subsequent visits: None   Pharmacist comments:  Carlos [name] is a pleasant 60 yo M presenting with his wife and a current medication list. He reports good compliance with his regimen and did not have any specific medication-related questions or concerns for me at this time.   Ruta Hinds. Velva Harman, PharmD, BCPS, CPP Clinical Pharmacist Pager: [phone] Phone: [phone] 07/13/2015 9:08 AM      Time with patient: 10 minutes Preparation and documentation time: 2 minutes Total time: 12 minutes

## 2015-07-13 NOTE — Telephone Encounter (Signed)
Left message for patient to call and schedule carotid doppler ordered by Dr. Aundra Dubin

## 2015-07-13 NOTE — Patient Instructions (Addendum)
INCREASE Entresto to 49/51 mg tablet twice daily.  Will schedule you for Cardiac MRI at [facility] Radiology department will call you to schedule.  Will schedule you for carotid dopplers at [facility] office. Address: [address], [place], [place] [address]  Phone: [phone]  ___________________________________________________  ___________________________________________________  Routine lab work today. Will notify you of abnormal results, otherwise no news is good news!  Get lab redrawn in 10 days.  Follow up 6 weeks with Dr. Aundra Dubin.  Do the following things EVERYDAY: 1) Weigh yourself in the morning before breakfast. Write it down and keep it in a log. 2) Take your medicines as prescribed 3) Eat low salt foods-Limit salt (sodium) to 2000 mg per day.  4) Stay as active as you can everyday 5) Limit all fluids for the day to less than 2 liters

## 2015-07-13 NOTE — Progress Notes (Signed)
Patient ID: Carlos [name], male   DOB: 04/26/1955, 60 y.o.   MRN: [id number]    Advanced Heart Failure Clinic Note   PCP: Dr. Venetia Maxon [facility]) Cardiology: Dr. Aundra Dubin  60 yo with minimal past history presents for evaluation of newly-found cardiomyopathy.  Patient had minimal medical care up until December.  He had not seen a doctor for years.  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  Around Thanksgiving of 2016, he developed a cough and exertional shortness of breath.  He went to an urgent care and had a CXR that was suggestive of PNA, also showed cardiomegaly.  He did not improve with antibiotics.  Dyspnea became progressively worse and he developed ankle edema.  At this point, he went to see Dr Venetia Maxon in [place].  He was found to be hypertensive and was started on Coreg and losartan.  Atorvastatin was started for elevated lipids.  He was sent for an echo at the hospital in [place], [place] was 25-30%. He was therefore referred here.  Right and left heart cath showed minimal CAD, reasonably optimized filling pressures on Lasix.  Cardiac index was low but not markedly so.   He returns today for HF follow up. At last visit coreg increased. Weight up 9 lbs since last visit (3 months ago.) No smoking since April 16, 2014 with presume throat cancer. Had Biopsy of throat mass 04/17/15 at [facility] showed invasive poorly differentiated squamous cell carcinoma, with 1 lymph node positive. Had surgical exploration removal in 04/2015. No chemo or radiation required. Has follow up next month.  He denies any exertional dyspnea. Still camping, hiking, and kayaking. 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 Appetite continues to improve.  Denies CP, lightheadedness, dizziness, orthopnea, or bendopnea.  Had a sausage biscuit before he came into clinic. Watching salt, drinks less than 2 L a day.    Labs  (12/16): K 4.5, creatinine 1.21, LFTs normal Labs (1/17): K 4.8, creatinine 1.13 Labs (2/17): K 5.1 => 4.4, creatinine 1.69 => 1.1 Labs (4/17): K 4.2, Creatinine 1.12  PMH: 1. Hyperlipidemia 2. HTN 3. Active smoker.  4. Chronic systolic CHF: Nonischemic cardiomyopathy.  - Echo (12/16) with EF 25-30%, restrictive diastolic function, mildly enlarged RV with mildly decreased systolic function, moderate MR, moderate TR, PASP 67 mmHg.  - LHC/RHC (1/17): Mild, nonobstructive CAD; mean RA 7, PA 42/16 mean 26, mean PCWP 17, CI 2.14, PVR 2.52 WU. 5. Throat cancer: S/p surgery at [facility] in 2017.   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   FH: No premature CAD, no cardiomyopathy.   ROS: All systems reviewed and negative except as per HPI.   Current Outpatient Prescriptions  Medication Sig Dispense Refill  . atorvastatin (LIPITOR) 80 MG tablet Take 0.5 tablets (40 mg total) by mouth daily. 15 tablet 3  . carvedilol (COREG) 12.5 MG tablet Take 1 tablet (12.5 mg total) by mouth 2 (two) times daily with a meal. 60 tablet 3  . furosemide (LASIX) 40 MG tablet Take 0.5 tablets (20 mg total) by mouth daily. 45 tablet 3  . sacubitril-valsartan (ENTRESTO) 24-26 MG Take 1 tablet by mouth 2 (two) times daily. 60 tablet 3  . spironolactone (ALDACTONE) 25 MG tablet Take 0.5 tablets (12.5 mg total) by mouth daily. 45 tablet 3  No current facility-administered medications for this encounter.   BP 140/70 mmHg  Pulse 61  Ht 5\' 4"  (1.626 m)  Wt 164 lb (74.39 kg)  BMI 28.14 kg/m2  SpO2 97%   Wt Readings from Last 3 Encounters:  07/13/15 164 lb (74.39 kg)  03/23/15 155 lb 8 oz (70.534 kg)  02/23/15 142 lb 12 oz (64.751 kg)   General: NAD Neck: JVP 6-7 cm, no thyromegaly or thyroid nodule.  Bilateral carotid bruit R>L Lungs: CTAB, normal effort CV: Lateral PMI.  Heart regular S1/S2, no S3/S4, no murmur.   N Abdomen: Soft, NT, ND, no HSM. No bruits or  masses. +BS  Skin: Intact without lesions or rashes.  Neurologic: Alert and oriented x 3.  Psych: Normal affect. Extremities: No clubbing or cyanosis. No peripheral edema.ormal pedal pulses.  HEENT: Normal.   Assessment/Plan: 1. Chronic systolic CHF: Nonischemic cardiomyopathy, EF 25-30% on 12/16 echo.  LHC/RHC (1/17) showed mild nonobstructive CAD and near-normal filling pressures on Lasix.  Possible viral myocarditis: episode seemed to start with an infectious event (possible PNA).  Also, must consider hypertensive cardiomyopathy with possibly years of untreated hypertension.  However, he does not have much LVH on his ECG.  Not a heavy drinker.  Volume status much improved.  NYHA class II symptoms.   - Continue Lasix 20 mg daily - Continue Coreg 12.5 mg bid  - Increase Entresto 49/51 mg BID. BMET today and 10 days.  - Continue spironolactone 12.5 mg daily. - Holding off on ASA 81 with prior excessive bruising on ASA.   - Will order Cardiac MRI to assess for infiltrative disease and to see if EF remains low => will need ICD evaluation if so.  He has a nonischemic cardiomyopathy but is relatively young.  Narrow QRS so would not be CRT candidate.  2. HTN: BP mildly elevated, med changes as above.  3. Hyperlipidemia: Continue atorvastatin 40 mg daily.  4. Smoking:  Congratulated on his continued abstinence.  5. Carotid bruit: Will order Carotid US for evaluation for stenosis.  Has bilateral bruit, R >L.   Shirley Friar, PA-C   Patient seen with PA, agree with the above note.  He is doing well, not volume overloaded on exam.  I will increase his Entresto to 49/51 bid with BMET today and again in 10 days.  Will get cardiac MRI to reassess LV function for possible ICD and also to look for any evidence for infiltrative disease.    I will arrange for carotid US given bruits.  He does have a history of smoking.   Followup 1 month after MRI.   [name] 07/13/2015 9:54 AM

## 2015-07-24 ENCOUNTER — Ambulatory Visit (HOSPITAL_COMMUNITY)
Admission: RE | Admit: 2015-07-24 | Discharge: 2015-07-24 | Disposition: A | Discharge status: Home / Self Care | Payer: 59 | Source: Ambulatory Visit | Attending: Cardiology | Admitting: Cardiology

## 2015-07-24 ENCOUNTER — Ambulatory Visit (HOSPITAL_COMMUNITY)
Admission: RE | Admit: 2015-07-24 | Discharge: 2015-07-24 | Disposition: A | Discharge status: Home / Self Care | Payer: 59 | Source: Ambulatory Visit | Attending: Internal Medicine | Admitting: Internal Medicine

## 2015-07-24 ENCOUNTER — Telehealth (HOSPITAL_COMMUNITY): Payer: Self-pay | Admitting: *Deleted

## 2015-07-24 DIAGNOSIS — I6523 Occlusion and stenosis of bilateral carotid arteries: Secondary | ICD-10-CM | POA: Insufficient documentation

## 2015-07-24 DIAGNOSIS — E785 Hyperlipidemia, unspecified: Secondary | ICD-10-CM | POA: Insufficient documentation

## 2015-07-24 DIAGNOSIS — Z87891 Personal history of nicotine dependence: Secondary | ICD-10-CM | POA: Insufficient documentation

## 2015-07-24 DIAGNOSIS — I5022 Chronic systolic (congestive) heart failure: Secondary | ICD-10-CM | POA: Diagnosis not present

## 2015-07-24 DIAGNOSIS — I110 Hypertensive heart disease with heart failure: Secondary | ICD-10-CM | POA: Diagnosis not present

## 2015-07-24 DIAGNOSIS — R0989 Other specified symptoms and signs involving the circulatory and respiratory systems: Secondary | ICD-10-CM | POA: Diagnosis not present

## 2015-07-24 LAB — BASIC METABOLIC PANEL
Anion gap: 7 (ref 5–15)
BUN: 18 mg/dL (ref 6–20)
CALCIUM: 9.6 mg/dL (ref 8.9–10.3)
CHLORIDE: 106 mmol/L (ref 101–111)
CO2: 26 mmol/L (ref 22–32)
CREATININE: 1.28 mg/dL — AB (ref 0.61–1.24)
GFR calc non Af Amer: >60 mL/min (ref >60)
GLUCOSE: 126 mg/dL — AB (ref 65–99)
Potassium: 4.3 mmol/L (ref 3.5–5.1)
Sodium: 139 mmol/L (ref 135–145)

## 2015-07-24 NOTE — Telephone Encounter (Signed)
Dr.McLean did not complete the peer-to-peer discussion so CMRI was denied by insurance.

## 2015-07-28 ENCOUNTER — Telehealth (HOSPITAL_COMMUNITY): Payer: Self-pay

## 2015-07-28 MED ORDER — SACUBITRIL-VALSARTAN 49-51 MG PO TABS: 1.0000 | ORAL_TABLET | Freq: Two times a day (BID) | ORAL | Status: DC

## 2015-07-28 NOTE — Telephone Encounter (Signed)
Wife calling CHF clinic triage line to request Entresto refill. States he seems to be doing great since dose increase to 49/51 mg tablets. Stable labs drawn last week also reviewed with patient. Rx refills sent to preferred pharmacy electronically.  Renee Pain

## 2015-07-31 ENCOUNTER — Telehealth (HOSPITAL_COMMUNITY): Payer: Self-pay

## 2015-07-31 NOTE — Telephone Encounter (Signed)
Notes Recorded by Effie Berkshire, RN on 07/31/2015 at 11:54 AM Left confidential VM of results after multiple attempts to reach patient. Notes Recorded by Larey Dresser, MD on 07/31/2015 at 12:48 AM 40-59% RICA, repeat 1 year.

## 2015-09-01 ENCOUNTER — Encounter (HOSPITAL_COMMUNITY): Payer: Self-pay

## 2015-09-01 ENCOUNTER — Ambulatory Visit (HOSPITAL_COMMUNITY)
Admission: RE | Admit: 2015-09-01 | Discharge: 2015-09-01 | Disposition: A | Discharge status: Home / Self Care | Payer: 59 | Source: Ambulatory Visit | Attending: Cardiology | Admitting: Cardiology

## 2015-09-01 VITALS — BP 124/70 | HR 56 | Wt 167.0 lb

## 2015-09-01 DIAGNOSIS — Z7982 Long term (current) use of aspirin: Secondary | ICD-10-CM | POA: Diagnosis not present

## 2015-09-01 DIAGNOSIS — R0989 Other specified symptoms and signs involving the circulatory and respiratory systems: Secondary | ICD-10-CM | POA: Diagnosis not present

## 2015-09-01 DIAGNOSIS — Z87891 Personal history of nicotine dependence: Secondary | ICD-10-CM | POA: Diagnosis not present

## 2015-09-01 DIAGNOSIS — Z85819 Personal history of malignant neoplasm of unspecified site of lip, oral cavity, and pharynx: Secondary | ICD-10-CM | POA: Diagnosis not present

## 2015-09-01 DIAGNOSIS — I6521 Occlusion and stenosis of right carotid artery: Secondary | ICD-10-CM | POA: Insufficient documentation

## 2015-09-01 DIAGNOSIS — I10 Essential (primary) hypertension: Secondary | ICD-10-CM

## 2015-09-01 DIAGNOSIS — I5022 Chronic systolic (congestive) heart failure: Secondary | ICD-10-CM | POA: Insufficient documentation

## 2015-09-01 DIAGNOSIS — Z79899 Other long term (current) drug therapy: Secondary | ICD-10-CM | POA: Diagnosis not present

## 2015-09-01 DIAGNOSIS — E785 Hyperlipidemia, unspecified: Secondary | ICD-10-CM | POA: Diagnosis not present

## 2015-09-01 DIAGNOSIS — I110 Hypertensive heart disease with heart failure: Secondary | ICD-10-CM | POA: Diagnosis not present

## 2015-09-01 DIAGNOSIS — I428 Other cardiomyopathies: Secondary | ICD-10-CM | POA: Insufficient documentation

## 2015-09-01 LAB — BASIC METABOLIC PANEL
ANION GAP: 7 (ref 5–15)
BUN: 19 mg/dL (ref 6–20)
CALCIUM: 9.5 mg/dL (ref 8.9–10.3)
CO2: 26 mmol/L (ref 22–32)
Chloride: 107 mmol/L (ref 101–111)
Creatinine, Ser: 1.28 mg/dL — ABNORMAL HIGH (ref 0.61–1.24)
Glucose, Bld: 87 mg/dL (ref 65–99)
POTASSIUM: 4.3 mmol/L (ref 3.5–5.1)
SODIUM: 140 mmol/L (ref 135–145)

## 2015-09-01 LAB — LIPID PANEL
CHOL/HDL RATIO: 4.1 ratio
CHOLESTEROL: 141 mg/dL (ref 0–200)
HDL: 34 mg/dL — AB (ref >40)
LDL Cholesterol: 58 mg/dL (ref 0–99)
TRIGLYCERIDES: 244 mg/dL — AB (ref <150)
VLDL: 49 mg/dL — ABNORMAL HIGH (ref 0–40)

## 2015-09-01 MED ORDER — ASPIRIN EC 81 MG PO TBEC: 81.0000 mg | DELAYED_RELEASE_TABLET | ORAL | 3 refills | Status: DC

## 2015-09-01 MED ORDER — SPIRONOLACTONE 25 MG PO TABS: 25.0000 mg | ORAL_TABLET | Freq: Every day | ORAL | 3 refills | Status: DC

## 2015-09-01 NOTE — Patient Instructions (Signed)
Increase Spironolactone to 25mg  daily.  Start Aspirin 81mg  every other day.  Routine lab work today. Will notify you of abnormal results   Follow up with Dr.McLean in 6 weeks.  Your provider requests you have a Cardiac MRI.  They will call you to schedule appointment.

## 2015-09-01 NOTE — Progress Notes (Signed)
Patient ID: Carlos [name], male   DOB: 1955/11/10, 60 y.o.   MRN: [id number]    Advanced Heart Failure Clinic Note   PCP: Dr. Venetia Maxon [facility]) Cardiology: Dr. Aundra Dubin  60 yo with minimal past history presents for evaluation of newly-found cardiomyopathy.  Patient had minimal medical care up until December.  He had not seen a doctor for years.  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  Around Thanksgiving of 2016, he developed a cough and exertional shortness of breath.  He went to an urgent care and had a CXR that was suggestive of PNA, also showed cardiomegaly.  He did not improve with antibiotics.  Dyspnea became progressively worse and he developed ankle edema.  At this point, he went to see Dr Venetia Maxon in [place].  He was found to be hypertensive and was started on Coreg and losartan.  Atorvastatin was started for elevated lipids.  He was sent for an echo at the hospital in [place], [place] was 25-30%. He was therefore referred here.  Right and left heart cath showed minimal CAD, reasonably optimized filling pressures on Lasix.  Cardiac index was low but not markedly so.   No smoking since April 16, 2015 with presume throat cancer. Had biopsy of throat mass 04/17/15 at [facility] showing invasive poorly differentiated squamous cell carcinoma, with 1 lymph node positive. Had surgical removal in 04/2015. No chemo or radiation required.   He is doing well currently.  Working full time.  Very active.  No exertional dyspnea or chest pain.  No orthopnea/PND.  Weight stable.      Labs (12/16): K 4.5, creatinine 1.21, LFTs normal Labs (1/17): K 4.8, creatinine 1.13 Labs (2/17): K 5.1 => 4.4, creatinine 1.69 => 1.1 Labs (4/17): K 4.2, Creatinine 1.12 Labs (7/17): K 4.3, creatinine 1.28  PMH: 1. Hyperlipidemia 2. HTN 3. Prior smoker.  4. Chronic systolic CHF: Nonischemic cardiomyopathy.  - Echo  (12/16) with EF 25-30%, restrictive diastolic function, mildly enlarged RV with mildly decreased systolic function, moderate MR, moderate TR, PASP 67 mmHg.  - LHC/RHC (1/17): Mild, nonobstructive CAD; mean RA 7, PA 42/16 mean 26, mean PCWP 17, CI 2.14, PVR 2.52 WU. 5. Throat cancer: S/p surgery at [facility] in 2017.  6. Carotid stenosis: Carotid dopplers 7/17 with 40-59% RICA stenosis.   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   FH: No premature CAD, no cardiomyopathy.   ROS: All systems reviewed and negative except as per HPI.   Current Outpatient Prescriptions  Medication Sig Dispense Refill  . atorvastatin (LIPITOR) 80 MG tablet Take 0.5 tablets (40 mg total) by mouth daily. 15 tablet 3  . carvedilol (COREG) 12.5 MG tablet Take 1 tablet (12.5 mg total) by mouth 2 (two) times daily with a meal. 60 tablet 3  . furosemide (LASIX) 40 MG tablet Take 0.5 tablets (20 mg total) by mouth daily. 45 tablet 3  . sacubitril-valsartan (ENTRESTO) 49-51 MG Take 1 tablet by mouth 2 (two) times daily. 60 tablet 6  . spironolactone (ALDACTONE) 25 MG tablet Take 1 tablet (25 mg total) by mouth daily. 90 tablet 3  . aspirin EC 81 MG tablet Take 1 tablet (81 mg total) by mouth every other day. 15 tablet 3   No current facility-administered medications for this encounter.    BP  124/70   Pulse (!) 56   Wt 167 lb (75.8 kg)   SpO2 98%   BMI 28.67 kg/m    Wt Readings from Last 3 Encounters:  09/01/15 167 lb (75.8 kg)  07/13/15 164 lb (74.4 kg)  03/23/15 155 lb 8 oz (70.5 kg)   General: NAD Neck: JVP 6-7 cm, no thyromegaly or thyroid nodule.  Bilateral carotid bruit R>L Lungs: CTAB, normal effort CV: Lateral PMI.  Heart regular S1/S2, no S3/S4, no murmur.    Abdomen: Soft, NT, ND, no HSM. No bruits or masses. +BS  Skin: Intact without lesions or rashes.  Neurologic: Alert and oriented x 3.  Psych: Normal affect. Extremities: No clubbing or cyanosis. No  peripheral edema. Normal pedal pulses.  HEENT: Normal.   Assessment/Plan: 1. Chronic systolic CHF: Nonischemic cardiomyopathy, EF 25-30% on 12/16 echo.  LHC/RHC (1/17) showed mild nonobstructive CAD and near-normal filling pressures on Lasix.  Possible viral myocarditis: episode seemed to start with an infectious event (possible PNA).  Also, must consider hypertensive cardiomyopathy with possibly years of untreated hypertension.  However, he does not have much LVH on his ECG.  Not a heavy drinker.  Volume status much improved on CHF meds.  NYHA class I-II symptoms.   - Continue Lasix 20 mg daily - Continue Coreg 12.5 mg bid  - Continue Entresto 49/51 mg BID.   - Increase spironolactone to 25 mg daily with BMET today and again in 2 wks.   - Ordered cardiac MRI to assess for infiltrative disease and to see if EF remains low => will need ICD evaluation if so.  He has a nonischemic cardiomyopathy but is relatively young.  Narrow QRS so would not be CRT candidate.  I called his insurance company today and got approval for the MRI.  2. HTN: BP controlled.   3. Hyperlipidemia: Continue atorvastatin 40 mg daily. Check lipids today.  Goal LDL < 70 with vascular disease (carotid stenosis).  4. Smoking:  Congratulated on his continued abstinence.  5. Carotid stenosis: Repeat dopplers in 7/18.  Needs to continue statin and would like him to try ASA 81 every other day (excessive bruising with daily ASA).   [name] 09/01/2015

## 2015-09-02 ENCOUNTER — Encounter: Payer: Self-pay | Admitting: Cardiology

## 2015-09-10 ENCOUNTER — Ambulatory Visit (HOSPITAL_COMMUNITY)
Admission: RE | Admit: 2015-09-10 | Discharge: 2015-09-10 | Disposition: A | Discharge status: Home / Self Care | Payer: 59 | Source: Ambulatory Visit | Attending: Cardiology | Admitting: Cardiology

## 2015-09-10 DIAGNOSIS — I5022 Chronic systolic (congestive) heart failure: Secondary | ICD-10-CM | POA: Insufficient documentation

## 2015-09-10 DIAGNOSIS — I429 Cardiomyopathy, unspecified: Secondary | ICD-10-CM | POA: Diagnosis not present

## 2015-09-10 MED ORDER — GADOBENATE DIMEGLUMINE 529 MG/ML IV SOLN
23.0000 mL | Freq: Once | INTRAVENOUS | Status: AC | PRN
  Administered 2015-09-10: 23 mL via INTRAVENOUS

## 2015-09-25 ENCOUNTER — Other Ambulatory Visit (HOSPITAL_COMMUNITY): Payer: Self-pay | Admitting: Cardiology

## 2015-09-29 ENCOUNTER — Telehealth (HOSPITAL_COMMUNITY): Payer: Self-pay | Admitting: *Deleted

## 2015-09-29 NOTE — Telephone Encounter (Signed)
Pt's wife given results of cMRI

## 2015-10-14 ENCOUNTER — Ambulatory Visit (HOSPITAL_COMMUNITY)
Admission: RE | Admit: 2015-10-14 | Discharge: 2015-10-14 | Disposition: A | Discharge status: Home / Self Care | Payer: 59 | Source: Ambulatory Visit | Attending: Cardiology | Admitting: Cardiology

## 2015-10-14 ENCOUNTER — Encounter (HOSPITAL_COMMUNITY): Payer: Self-pay

## 2015-10-14 VITALS — BP 104/62 | HR 63 | Wt 173.0 lb

## 2015-10-14 DIAGNOSIS — E785 Hyperlipidemia, unspecified: Secondary | ICD-10-CM | POA: Insufficient documentation

## 2015-10-14 DIAGNOSIS — I10 Essential (primary) hypertension: Secondary | ICD-10-CM

## 2015-10-14 DIAGNOSIS — I110 Hypertensive heart disease with heart failure: Secondary | ICD-10-CM | POA: Diagnosis not present

## 2015-10-14 DIAGNOSIS — R0989 Other specified symptoms and signs involving the circulatory and respiratory systems: Secondary | ICD-10-CM | POA: Diagnosis not present

## 2015-10-14 DIAGNOSIS — I428 Other cardiomyopathies: Secondary | ICD-10-CM | POA: Diagnosis not present

## 2015-10-14 DIAGNOSIS — Z7982 Long term (current) use of aspirin: Secondary | ICD-10-CM | POA: Insufficient documentation

## 2015-10-14 DIAGNOSIS — I6529 Occlusion and stenosis of unspecified carotid artery: Secondary | ICD-10-CM | POA: Diagnosis not present

## 2015-10-14 DIAGNOSIS — M79669 Pain in unspecified lower leg: Secondary | ICD-10-CM | POA: Diagnosis not present

## 2015-10-14 DIAGNOSIS — Z79899 Other long term (current) drug therapy: Secondary | ICD-10-CM | POA: Insufficient documentation

## 2015-10-14 DIAGNOSIS — I5022 Chronic systolic (congestive) heart failure: Secondary | ICD-10-CM | POA: Insufficient documentation

## 2015-10-14 DIAGNOSIS — Z87891 Personal history of nicotine dependence: Secondary | ICD-10-CM | POA: Diagnosis not present

## 2015-10-14 DIAGNOSIS — I2510 Atherosclerotic heart disease of native coronary artery without angina pectoris: Secondary | ICD-10-CM | POA: Diagnosis not present

## 2015-10-14 MED ORDER — SPIRONOLACTONE 25 MG PO TABS: 12.5000 mg | ORAL_TABLET | Freq: Every day | ORAL | 3 refills | Status: DC

## 2015-10-14 NOTE — Patient Instructions (Signed)
Labs in 3 months  We will contact you in 6 months to schedule your next appointment.

## 2015-10-14 NOTE — Progress Notes (Signed)
Patient ID: Carlos [name], male   DOB: 1955-05-16, 60 y.o.   MRN: [id number]    Advanced Heart Failure Clinic Note   PCP: Dr. Venetia Maxon [facility]) Cardiology: Dr. Aundra Dubin  60 yo with minimal past history presents for evaluation of newly-found cardiomyopathy.  Patient had minimal medical care up until December.  He had not seen a doctor for years.  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  Around Thanksgiving of 2016, he developed a cough and exertional shortness of breath.  He went to an urgent care and had a CXR that was suggestive of PNA, also showed cardiomegaly.  He did not improve with antibiotics.  Dyspnea became progressively worse and he developed ankle edema.  At this point, he went to see Dr Venetia Maxon in [place].  He was found to be hypertensive and was started on Coreg and losartan.  Atorvastatin was started for elevated lipids.  He was sent for an echo at the hospital in [place], [place] was 25-30%. He was therefore referred here.  Right and left heart cath showed minimal CAD, reasonably optimized filling pressures on Lasix.  Cardiac index was low but not markedly so.   No smoking since April 16, 2015 with presume throat cancer. Had biopsy of throat mass 04/17/15 at [facility] showing invasive poorly differentiated squamous cell carcinoma, with 1 lymph node positive. Had surgical removal in 04/2015. No chemo or radiation required.   Cardiac MRI was done in 8/17.  EF up to 63% with normal RV.  No late gadolinium enhancement.   He is doing well currently.  Working full time.  Very active.  No exertional dyspnea or chest pain.  No orthopnea/PND.  Weight stable.  Some pain in his calves bilaterally with strenuous exertion (walking up a steep hill).   Labs (12/16): K 4.5, creatinine 1.21, LFTs normal Labs (1/17): K 4.8, creatinine 1.13 Labs (2/17): K 5.1 => 4.4, creatinine 1.69 => 1.1 Labs (4/17): K  4.2, Creatinine 1.12 Labs (7/17): K 4.3, creatinine 1.28 Labs (8/17): LDL 58, HDL 34 Labs (9/17): K 4.7, creatinine 1.44  PMH: 1. Hyperlipidemia 2. HTN 3. Prior smoker.  4. Chronic systolic CHF: Nonischemic cardiomyopathy.  - Echo (12/16) with EF 25-30%, restrictive diastolic function, mildly enlarged RV with mildly decreased systolic function, moderate MR, moderate TR, PASP 67 mmHg.  - LHC/RHC (1/17): Mild, nonobstructive CAD; mean RA 7, PA 42/16 mean 26, mean PCWP 17, CI 2.14, PVR 2.52 WU. - Cardiac MRI (8/17): EF 63%, normal RV size and systolic function, no late gadolinium enhancement.  5. Throat cancer: S/p surgery at [facility] in 2017.  6. Carotid stenosis: Carotid dopplers 7/17 with 40-59% RICA stenosis.   SH: Lives in [place] (near [place]), quit smoking 2/17, no ETOH, drives garbage truck.   FH: No premature CAD, no cardiomyopathy.   ROS: All systems reviewed and negative except as per HPI.   Current Outpatient Prescriptions  Medication Sig Dispense Refill  . aspirin EC 81 MG tablet Take 1 tablet (81 mg total) by mouth every other day. 15 tablet 3  . atorvastatin (LIPITOR) 80 MG tablet Take 0.5 tablets (40 mg total) by mouth daily. 15 tablet 3  . carvedilol (COREG) 12.5 MG tablet Take 1 tablet (12.5 mg total) by mouth 2 (two) times daily with a meal. 60 tablet 3  . sacubitril-valsartan (ENTRESTO) 49-51 MG Take  1 tablet by mouth 2 (two) times daily. 60 tablet 6  . spironolactone (ALDACTONE) 25 MG tablet Take 0.5 tablets (12.5 mg total) by mouth daily. 90 tablet 3   No current facility-administered medications for this encounter.    BP 104/62   Pulse 63   Wt 173 lb (78.5 kg)   SpO2 97%   BMI 29.70 kg/m    Wt Readings from Last 3 Encounters:  10/14/15 173 lb (78.5 kg)  09/01/15 167 lb (75.8 kg)  07/13/15 164 lb (74.4 kg)   General: NAD Neck: JVP 6-7 cm, no thyromegaly or thyroid nodule.  Bilateral carotid bruit R>L Lungs: CTAB, normal effort CV: Lateral PMI.   Heart regular S1/S2, no S3/S4, no murmur.    Abdomen: Soft, NT, ND, no HSM. No bruits or masses. +BS  Skin: Intact without lesions or rashes.  Neurologic: Alert and oriented x 3.  Psych: Normal affect. Extremities: No clubbing or cyanosis. No peripheral edema. Difficult to palpate pedal pulses.  HEENT: Normal.   Assessment/Plan: 1. Chronic systolic CHF: Nonischemic cardiomyopathy, EF 25-30% on 12/16 echo.  LHC/RHC (1/17) showed mild nonobstructive CAD and near-normal filling pressures on Lasix.  Possible viral myocarditis: episode seemed to start with an infectious event (possible PNA).  Also, must consider hypertensive cardiomyopathy with possibly years of untreated hypertension.  However, he does not have much LVH on his ECG.  Not a heavy drinker.  Volume status much improved on CHF meds.  NYHA class I-II symptoms.  Cardiac MRI in 8/17 showed improvement in EF to 63% with no late gadolinium enhancement.  - I think that he can stop Lasix and will decrease spironolactone to 12.5 mg daily.  - Continue Coreg 12.5 mg bid  - Continue Entresto 49/51 mg BID.   I2. HTN: BP controlled.   3. Hyperlipidemia: Continue atorvastatin 40 mg daily. Good lipids when recently checked.  Goal LDL < 70 with vascular disease (carotid stenosis).  4. Smoking:  Congratulated on his continued abstinence.  5. Carotid stenosis: Repeat dopplers in 7/18.  Needs to continue statin and ASA 81 daily.  6. Calf pain: Calf pain with heavy exertion may be claudication as it is difficult to palpate his pedal pulses.  As symptoms are minimal and he is on ASA and statin already, no further workup necessary at this time.   [name] 10/14/2015

## 2015-11-23 ENCOUNTER — Other Ambulatory Visit (HOSPITAL_COMMUNITY): Payer: Self-pay | Admitting: *Deleted

## 2015-11-23 MED ORDER — CARVEDILOL 12.5 MG PO TABS: 12.5000 mg | ORAL_TABLET | Freq: Two times a day (BID) | ORAL | 3 refills | Status: DC

## 2016-01-26 DIAGNOSIS — Z8521 Personal history of malignant neoplasm of larynx: Secondary | ICD-10-CM | POA: Diagnosis not present

## 2016-01-26 DIAGNOSIS — Z87891 Personal history of nicotine dependence: Secondary | ICD-10-CM | POA: Diagnosis not present

## 2016-01-26 DIAGNOSIS — Z08 Encounter for follow-up examination after completed treatment for malignant neoplasm: Secondary | ICD-10-CM | POA: Diagnosis not present

## 2016-01-26 DIAGNOSIS — Z9002 Acquired absence of larynx: Secondary | ICD-10-CM | POA: Diagnosis not present

## 2016-03-21 ENCOUNTER — Other Ambulatory Visit (HOSPITAL_COMMUNITY): Payer: Self-pay | Admitting: Cardiology

## 2016-04-04 ENCOUNTER — Telehealth (HOSPITAL_COMMUNITY): Payer: Self-pay

## 2016-04-04 NOTE — Telephone Encounter (Signed)
PA for Entresto approved by [employer] through 03/30/2017  Belia Heman, PharmD PGY1 Pharmacy Resident [phone] (Pager) 04/04/2016 9:34 AM

## 2016-04-13 ENCOUNTER — Telehealth (HOSPITAL_COMMUNITY): Payer: Self-pay | Admitting: Vascular Surgery

## 2016-04-13 ENCOUNTER — Ambulatory Visit (HOSPITAL_COMMUNITY)
Admission: RE | Admit: 2016-04-13 | Discharge: 2016-04-13 | Disposition: A | Discharge status: Home / Self Care | Payer: 59 | Source: Ambulatory Visit | Attending: Cardiology | Admitting: Cardiology

## 2016-04-13 ENCOUNTER — Encounter (HOSPITAL_COMMUNITY): Payer: Self-pay

## 2016-04-13 VITALS — BP 152/67 | HR 58 | Wt 178.5 lb

## 2016-04-13 DIAGNOSIS — I10 Essential (primary) hypertension: Secondary | ICD-10-CM | POA: Diagnosis not present

## 2016-04-13 DIAGNOSIS — R0989 Other specified symptoms and signs involving the circulatory and respiratory systems: Secondary | ICD-10-CM | POA: Diagnosis not present

## 2016-04-13 DIAGNOSIS — I110 Hypertensive heart disease with heart failure: Secondary | ICD-10-CM | POA: Insufficient documentation

## 2016-04-13 DIAGNOSIS — Z7982 Long term (current) use of aspirin: Secondary | ICD-10-CM | POA: Diagnosis not present

## 2016-04-13 DIAGNOSIS — I6521 Occlusion and stenosis of right carotid artery: Secondary | ICD-10-CM | POA: Insufficient documentation

## 2016-04-13 DIAGNOSIS — I429 Cardiomyopathy, unspecified: Secondary | ICD-10-CM | POA: Insufficient documentation

## 2016-04-13 DIAGNOSIS — I2510 Atherosclerotic heart disease of native coronary artery without angina pectoris: Secondary | ICD-10-CM | POA: Insufficient documentation

## 2016-04-13 DIAGNOSIS — Z87891 Personal history of nicotine dependence: Secondary | ICD-10-CM | POA: Diagnosis not present

## 2016-04-13 DIAGNOSIS — E785 Hyperlipidemia, unspecified: Secondary | ICD-10-CM | POA: Diagnosis not present

## 2016-04-13 DIAGNOSIS — M79661 Pain in right lower leg: Secondary | ICD-10-CM | POA: Diagnosis not present

## 2016-04-13 DIAGNOSIS — M79662 Pain in left lower leg: Secondary | ICD-10-CM | POA: Insufficient documentation

## 2016-04-13 DIAGNOSIS — I5022 Chronic systolic (congestive) heart failure: Secondary | ICD-10-CM

## 2016-04-13 LAB — BASIC METABOLIC PANEL
ANION GAP: 7 (ref 5–15)
BUN: 14 mg/dL (ref 6–20)
CALCIUM: 9.1 mg/dL (ref 8.9–10.3)
CO2: 25 mmol/L (ref 22–32)
Chloride: 107 mmol/L (ref 101–111)
Creatinine, Ser: 1.19 mg/dL (ref 0.61–1.24)
GFR calc non Af Amer: >60 mL/min (ref >60)
GLUCOSE: 127 mg/dL — AB (ref 65–99)
Potassium: 4.5 mmol/L (ref 3.5–5.1)
Sodium: 139 mmol/L (ref 135–145)

## 2016-04-13 MED ORDER — CARVEDILOL 12.5 MG PO TABS: 12.5000 mg | ORAL_TABLET | Freq: Two times a day (BID) | ORAL | 3 refills | Status: DC

## 2016-04-13 MED ORDER — SACUBITRIL-VALSARTAN 97-103 MG PO TABS: 1.0000 | ORAL_TABLET | Freq: Two times a day (BID) | ORAL | 3 refills | Status: DC

## 2016-04-13 MED ORDER — SPIRONOLACTONE 25 MG PO TABS: 12.5000 mg | ORAL_TABLET | Freq: Every day | ORAL | 3 refills | Status: DC

## 2016-04-13 MED ORDER — ATORVASTATIN CALCIUM 80 MG PO TABS: 40.0000 mg | ORAL_TABLET | Freq: Every day | ORAL | 3 refills | Status: DC

## 2016-04-13 NOTE — Patient Instructions (Signed)
INCREASE Entresto to 97/103mg  twice daily.  Routine lab work today. Will notify you of abnormal results  Repeat lab work in two weeks. *Have results faxed to [phone]*  Your provider requests you have carotid dopplers in July  Follow up with Dr.McLean in 6 months

## 2016-04-13 NOTE — Addendum Note (Signed)
Encounter addended by: Larey Dresser, MD on: 04/13/2016 10:03 PM<BR>    Actions taken: Sign clinical note

## 2016-04-13 NOTE — Progress Notes (Addendum)
Patient ID: Carlos [name], male   DOB: May 26, 1955, 61 y.o.   MRN: [id number]    Advanced Heart Failure Clinic Note   PCP: Dr. Venetia Maxon [facility]) Cardiology: Dr. Aundra Dubin  61 yo with minimal past history presents for evaluation of newly-found cardiomyopathy.  Patient had minimal medical care up until December.  He had not seen a doctor for years.  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  Around Thanksgiving of 2016, he developed a cough and exertional shortness of breath.  He went to an urgent care and had a CXR that was suggestive of PNA, also showed cardiomegaly.  He did not improve with antibiotics.  Dyspnea became progressively worse and he developed ankle edema.  At this point, he went to see Dr Venetia Maxon in [place].  He was found to be hypertensive and was started on Coreg and losartan.  Atorvastatin was started for elevated lipids.  He was sent for an echo at the hospital in [place], [place] was 25-30%. He was therefore referred here.  Right and left heart cath showed minimal CAD, reasonably optimized filling pressures on Lasix.  Cardiac index was low but not markedly so.   No smoking since April 16, 2015 with presume throat cancer. Had biopsy of throat mass 04/17/15 at [facility] showing invasive poorly differentiated squamous cell carcinoma, with 1 lymph node positive. Had surgical removal in 04/2015. No chemo or radiation required.   Cardiac MRI was done in 8/17.  EF up to 63% with normal RV.  No late gadolinium enhancement.   He is doing well currently.  Working full time.  Very active.  No shortness of breath, no chest pain.  No orthopnea/PND.  Weight up 5 lbs.  Some pain in his calves bilaterally with strenuous exertion (walking up a steep hill). BP has been running high when he checks at home and high today here.   Labs (12/16): K 4.5, creatinine 1.21, LFTs normal Labs (1/17): K 4.8,  creatinine 1.13 Labs (2/17): K 5.1 => 4.4, creatinine 1.69 => 1.1 Labs (4/17): K 4.2, Creatinine 1.12 Labs (7/17): K 4.3, creatinine 1.28 Labs (8/17): LDL 58, HDL 34 Labs (9/17): K 4.7, creatinine 1.44  ECG (personally reviewed): NSR, lateral TWIs  PMH: 1. Hyperlipidemia 2. HTN 3. Prior smoker.  4. Chronic systolic CHF: Nonischemic cardiomyopathy.  - Echo (12/16) with EF 25-30%, restrictive diastolic function, mildly enlarged RV with mildly decreased systolic function, moderate MR, moderate TR, PASP 67 mmHg.  - LHC/RHC (1/17): Mild, nonobstructive CAD; mean RA 7, PA 42/16 mean 26, mean PCWP 17, CI 2.14, PVR 2.52 WU. - Cardiac MRI (8/17): EF 63%, normal RV size and systolic function, no late gadolinium enhancement.  5. Throat cancer: S/p surgery at [facility] in 2017.  6. Carotid stenosis: Carotid dopplers 7/17 with 40-59% RICA stenosis.   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   FH: No premature CAD, no cardiomyopathy.   ROS: All systems reviewed and negative except as per HPI.   Current Outpatient Prescriptions  Medication Sig Dispense Refill  . aspirin EC 81 MG tablet Take 1 tablet (81 mg total) by mouth every other day. 15 tablet 3  . atorvastatin (LIPITOR) 80 MG tablet Take 0.5 tablets (40 mg total) by mouth daily. 15 tablet 3  . carvedilol (COREG) 12.5 MG tablet Take  1 tablet (12.5 mg total) by mouth 2 (two) times daily with a meal. 60 tablet 3  . spironolactone (ALDACTONE) 25 MG tablet Take 0.5 tablets (12.5 mg total) by mouth daily. 90 tablet 3  . sacubitril-valsartan (ENTRESTO) 97-103 MG Take 1 tablet by mouth 2 (two) times daily. 60 tablet 3   No current facility-administered medications for this encounter.    BP (!) 152/67   Pulse (!) 58   Wt 178 lb 8 oz (81 kg)   SpO2 98%   BMI 30.64 kg/m    Wt Readings from Last 3 Encounters:  04/13/16 178 lb 8 oz (81 kg)  10/14/15 173 lb (78.5 kg)  09/01/15 167 lb (75.8 kg)    General: NAD Neck: JVP not elevated, no thyromegaly or thyroid nodule.  Bilateral carotid bruits Lungs: CTAB, normal effort CV: Lateral PMI.  Heart regular S1/S2, no S3/S4, no murmur.    Abdomen: Soft, NT, ND, no HSM. No bruits or masses. +BS  Skin: Intact without lesions or rashes.  Neurologic: Alert and oriented x 3.  Psych: Normal affect. Extremities: No clubbing or cyanosis. No edema. Difficult to palpate pedal pulses.  HEENT: Normal.   Assessment/Plan: 1. Chronic systolic CHF: Nonischemic cardiomyopathy, EF 25-30% on 12/16 echo.  LHC/RHC (1/17) showed mild nonobstructive CAD and near-normal filling pressures on Lasix.  Possible viral myocarditis: episode seemed to start with an infectious event (possible PNA).  Also, must consider hypertensive cardiomyopathy with possibly years of untreated hypertension.  However, he does not have much LVH on his ECG.  Not a heavy drinker.  Volume status much improved on CHF meds.  NYHA class I-II symptoms.  Cardiac MRI in 8/17 showed improvement in EF to 63% with no late gadolinium enhancement.  - Continue Coreg 12.5 mg bid  - Given elevated BP, I will increase Entresto to 97/103 bid.  BMET today and again in 2 wks.  - He will continue spironolactone.    2. HTN: BP high, increasing Entresto as above.   3. Hyperlipidemia: Continue atorvastatin 40 mg daily. Good lipids when last checked.  Goal LDL < 70 with vascular disease (carotid stenosis).  4. Smoking:  Congratulated on his continued abstinence.  5. Carotid stenosis: Repeat dopplers in 7/18.  Needs to continue statin and ASA 81 daily.  6. Calf pain: Calf pain with heavy exertion may be claudication as it is difficult to palpate his pedal pulses.  As symptoms are minimal and he is on ASA and statin already, no further workup necessary at this time.   Followup in 6 months.   [name] 04/13/2016

## 2016-04-13 NOTE — Telephone Encounter (Signed)
Sent staff message to [facility] to call pt to schedule carotid doppler in July please advise

## 2016-04-26 DIAGNOSIS — I5022 Chronic systolic (congestive) heart failure: Secondary | ICD-10-CM | POA: Diagnosis not present

## 2016-05-13 ENCOUNTER — Other Ambulatory Visit (HOSPITAL_COMMUNITY): Payer: Self-pay | Admitting: Cardiology

## 2016-05-25 DIAGNOSIS — Z08 Encounter for follow-up examination after completed treatment for malignant neoplasm: Secondary | ICD-10-CM | POA: Diagnosis not present

## 2016-05-25 DIAGNOSIS — C321 Malignant neoplasm of supraglottis: Secondary | ICD-10-CM | POA: Diagnosis not present

## 2016-05-25 DIAGNOSIS — Z9889 Other specified postprocedural states: Secondary | ICD-10-CM | POA: Diagnosis not present

## 2016-05-25 DIAGNOSIS — Z8521 Personal history of malignant neoplasm of larynx: Secondary | ICD-10-CM | POA: Diagnosis not present

## 2016-05-25 DIAGNOSIS — Z9002 Acquired absence of larynx: Secondary | ICD-10-CM | POA: Diagnosis not present

## 2016-07-11 ENCOUNTER — Telehealth (HOSPITAL_COMMUNITY): Payer: Self-pay | Admitting: Cardiology

## 2016-07-13 NOTE — Telephone Encounter (Signed)
07/11/2016 11:34 AM Phone (Outgoing) Carlos [name], Carlos [name] (Self) [phone] (H)   Left Message - Called pt and lmsg for him to CB to schedule Carotid doppler.     By Cherie Dark A    07/11/2016 03:21 PM Phone ([place]) Kneeland,Darlene (EC) [phone] (H)   Left Message - Returned wife's call in regards to scheduling carotid doppler.     By Verdene Rio

## 2016-07-16 DIAGNOSIS — H40053 Ocular hypertension, bilateral: Secondary | ICD-10-CM | POA: Diagnosis not present

## 2016-07-22 ENCOUNTER — Ambulatory Visit (HOSPITAL_COMMUNITY)
Admission: RE | Admit: 2016-07-22 | Discharge: 2016-07-22 | Disposition: A | Discharge status: Home / Self Care | Payer: 59 | Source: Ambulatory Visit | Attending: Cardiovascular Disease | Admitting: Cardiovascular Disease

## 2016-07-22 DIAGNOSIS — E785 Hyperlipidemia, unspecified: Secondary | ICD-10-CM | POA: Diagnosis not present

## 2016-07-22 DIAGNOSIS — R0989 Other specified symptoms and signs involving the circulatory and respiratory systems: Secondary | ICD-10-CM | POA: Diagnosis present

## 2016-07-22 DIAGNOSIS — I10 Essential (primary) hypertension: Secondary | ICD-10-CM | POA: Diagnosis not present

## 2016-07-22 DIAGNOSIS — Z87891 Personal history of nicotine dependence: Secondary | ICD-10-CM | POA: Diagnosis not present

## 2016-07-22 DIAGNOSIS — I6523 Occlusion and stenosis of bilateral carotid arteries: Secondary | ICD-10-CM | POA: Diagnosis not present

## 2016-08-20 ENCOUNTER — Other Ambulatory Visit (HOSPITAL_COMMUNITY): Payer: Self-pay | Admitting: Cardiology

## 2016-09-27 DIAGNOSIS — Z87891 Personal history of nicotine dependence: Secondary | ICD-10-CM | POA: Diagnosis not present

## 2016-09-27 DIAGNOSIS — Z08 Encounter for follow-up examination after completed treatment for malignant neoplasm: Secondary | ICD-10-CM | POA: Diagnosis not present

## 2016-09-27 DIAGNOSIS — Z8521 Personal history of malignant neoplasm of larynx: Secondary | ICD-10-CM | POA: Diagnosis not present

## 2016-10-25 ENCOUNTER — Telehealth (HOSPITAL_COMMUNITY): Payer: Self-pay | Admitting: Pharmacist

## 2016-10-25 NOTE — Telephone Encounter (Signed)
Entresto 97-103 mg BID PA approved by [employer] commercial through 10/25/17.   Called Mr. Cregg to tell him about approval and he states that he just lost his job so he will no longer have insurance. Will send him the [employer] PAF application.   Ruta Hinds. Velva Harman, PharmD, BCPS, CPP Clinical Pharmacist Pager: [phone] Phone: [phone] 10/25/2016 9:29 AM

## 2016-11-02 ENCOUNTER — Encounter (HOSPITAL_COMMUNITY): Payer: Self-pay | Admitting: Pharmacist

## 2016-11-02 ENCOUNTER — Other Ambulatory Visit (HOSPITAL_COMMUNITY): Payer: Self-pay | Admitting: *Deleted

## 2016-11-02 MED ORDER — SACUBITRIL-VALSARTAN 97-103 MG PO TABS: 1.0000 | ORAL_TABLET | Freq: Two times a day (BID) | ORAL | 3 refills | Status: DC

## 2016-11-07 ENCOUNTER — Telehealth (HOSPITAL_COMMUNITY): Payer: Self-pay | Admitting: Pharmacist

## 2016-11-07 NOTE — Telephone Encounter (Signed)
[employer] patient assistance approved for Entresto 97-103 mg BID through 11/05/17.   Ruta Hinds. Velva Harman, PharmD, BCPS, CPP Clinical Pharmacist Pager: [phone] Phone: [phone] 11/07/2016 9:08 AM

## 2016-11-24 ENCOUNTER — Other Ambulatory Visit (HOSPITAL_COMMUNITY): Payer: Self-pay | Admitting: Cardiology

## 2017-01-31 DIAGNOSIS — Z08 Encounter for follow-up examination after completed treatment for malignant neoplasm: Secondary | ICD-10-CM | POA: Diagnosis not present

## 2017-01-31 DIAGNOSIS — Z87891 Personal history of nicotine dependence: Secondary | ICD-10-CM | POA: Diagnosis not present

## 2017-01-31 DIAGNOSIS — Z8521 Personal history of malignant neoplasm of larynx: Secondary | ICD-10-CM | POA: Diagnosis not present

## 2017-02-04 ENCOUNTER — Other Ambulatory Visit (HOSPITAL_COMMUNITY): Payer: Self-pay | Admitting: Cardiology

## 2017-03-21 ENCOUNTER — Telehealth (HOSPITAL_COMMUNITY): Payer: Self-pay | Admitting: *Deleted

## 2017-03-21 NOTE — Telephone Encounter (Signed)
Received PA through [facility] for pt's Entresto 49/51mg  tabs, PA completed, received notification back that med was approved 10/25/16 through 10/25/17.  Upon further review of chart, pt lost insurance back in October and was started on [employer] Pt Asst Program and his dose as been increased to 97/103 mg.    Have attempted to call pt to clarify where he is getting Entresto from and what dose is he taking.  Left message to call back

## 2017-03-23 NOTE — Telephone Encounter (Signed)
Patient's wife called back and he is taking entresto 97-103 mg and is currently getting it through [employer].  Patient was also due for appointment, appointment scheduled with Dr. Aundra Dubin.

## 2017-03-24 ENCOUNTER — Telehealth (HOSPITAL_COMMUNITY): Payer: Self-pay | Admitting: Pharmacist

## 2017-03-24 ENCOUNTER — Other Ambulatory Visit (HOSPITAL_COMMUNITY): Payer: Self-pay | Admitting: Pharmacist

## 2017-03-24 MED ORDER — SACUBITRIL-VALSARTAN 97-103 MG PO TABS: 1.0000 | ORAL_TABLET | Freq: Two times a day (BID) | ORAL | 3 refills | Status: DC

## 2017-03-24 NOTE — Telephone Encounter (Signed)
Entresto 97-103 mg BID PA approved by [employer] through 10/25/17.   Ruta Hinds. Velva Harman, PharmD, BCPS, CPP Clinical Pharmacist Phone: [phone] 03/24/2017 2:43 PM

## 2017-03-27 ENCOUNTER — Other Ambulatory Visit (HOSPITAL_COMMUNITY): Payer: Self-pay | Admitting: Internal Medicine

## 2017-05-15 ENCOUNTER — Ambulatory Visit (HOSPITAL_BASED_OUTPATIENT_CLINIC_OR_DEPARTMENT_OTHER)
Admission: RE | Admit: 2017-05-15 | Discharge: 2017-05-15 | Disposition: A | Discharge status: Home / Self Care | Payer: Worker's Compensation | Source: Ambulatory Visit | Attending: Cardiology | Admitting: Cardiology

## 2017-05-15 ENCOUNTER — Emergency Department (HOSPITAL_COMMUNITY): Payer: Worker's Compensation

## 2017-05-15 ENCOUNTER — Telehealth (HOSPITAL_COMMUNITY): Payer: Self-pay | Admitting: Cardiology

## 2017-05-15 ENCOUNTER — Emergency Department (HOSPITAL_COMMUNITY): Payer: Worker's Compensation | Admitting: Certified Registered"

## 2017-05-15 ENCOUNTER — Encounter (HOSPITAL_COMMUNITY): Payer: Self-pay | Admitting: Cardiology

## 2017-05-15 ENCOUNTER — Encounter (HOSPITAL_COMMUNITY): Payer: Self-pay | Admitting: Emergency Medicine

## 2017-05-15 ENCOUNTER — Other Ambulatory Visit: Payer: Self-pay

## 2017-05-15 ENCOUNTER — Ambulatory Visit (HOSPITAL_COMMUNITY)
Admission: EM | Admit: 2017-05-15 | Discharge: 2017-05-15 | Disposition: A | Discharge status: Home / Self Care | Payer: Worker's Compensation | Attending: Emergency Medicine | Admitting: Emergency Medicine

## 2017-05-15 ENCOUNTER — Encounter (HOSPITAL_COMMUNITY)
Admission: EM | Disposition: A | Discharge status: Home / Self Care | Payer: Self-pay | Source: Home / Self Care | Attending: Emergency Medicine

## 2017-05-15 VITALS — BP 146/90 | HR 54 | Wt 177.5 lb

## 2017-05-15 DIAGNOSIS — Z7982 Long term (current) use of aspirin: Secondary | ICD-10-CM | POA: Insufficient documentation

## 2017-05-15 DIAGNOSIS — W260XXA Contact with knife, initial encounter: Secondary | ICD-10-CM | POA: Insufficient documentation

## 2017-05-15 DIAGNOSIS — I5022 Chronic systolic (congestive) heart failure: Secondary | ICD-10-CM

## 2017-05-15 DIAGNOSIS — S65102A Unspecified injury of radial artery at wrist and hand level of left arm, initial encounter: Secondary | ICD-10-CM | POA: Diagnosis not present

## 2017-05-15 DIAGNOSIS — R0989 Other specified symptoms and signs involving the circulatory and respiratory systems: Secondary | ICD-10-CM

## 2017-05-15 DIAGNOSIS — S65112A Laceration of radial artery at wrist and hand level of left arm, initial encounter: Secondary | ICD-10-CM | POA: Diagnosis not present

## 2017-05-15 DIAGNOSIS — Z8249 Family history of ischemic heart disease and other diseases of the circulatory system: Secondary | ICD-10-CM | POA: Diagnosis not present

## 2017-05-15 DIAGNOSIS — I6523 Occlusion and stenosis of bilateral carotid arteries: Secondary | ICD-10-CM

## 2017-05-15 DIAGNOSIS — Z87891 Personal history of nicotine dependence: Secondary | ICD-10-CM | POA: Insufficient documentation

## 2017-05-15 DIAGNOSIS — Z79899 Other long term (current) drug therapy: Secondary | ICD-10-CM | POA: Diagnosis not present

## 2017-05-15 DIAGNOSIS — I2510 Atherosclerotic heart disease of native coronary artery without angina pectoris: Secondary | ICD-10-CM | POA: Diagnosis not present

## 2017-05-15 DIAGNOSIS — S55102A Unspecified injury of radial artery at forearm level, left arm, initial encounter: Secondary | ICD-10-CM

## 2017-05-15 DIAGNOSIS — S61512A Laceration without foreign body of left wrist, initial encounter: Secondary | ICD-10-CM | POA: Diagnosis present

## 2017-05-15 DIAGNOSIS — Z823 Family history of stroke: Secondary | ICD-10-CM | POA: Diagnosis not present

## 2017-05-15 DIAGNOSIS — I110 Hypertensive heart disease with heart failure: Secondary | ICD-10-CM | POA: Insufficient documentation

## 2017-05-15 DIAGNOSIS — Z23 Encounter for immunization: Secondary | ICD-10-CM | POA: Insufficient documentation

## 2017-05-15 DIAGNOSIS — I509 Heart failure, unspecified: Secondary | ICD-10-CM | POA: Diagnosis not present

## 2017-05-15 DIAGNOSIS — S55192A Other specified injury of radial artery at forearm level, left arm, initial encounter: Secondary | ICD-10-CM | POA: Diagnosis not present

## 2017-05-15 DIAGNOSIS — S41119A Laceration without foreign body of unspecified upper arm, initial encounter: Secondary | ICD-10-CM | POA: Diagnosis not present

## 2017-05-15 HISTORY — PX: ARTERY REPAIR: SHX559

## 2017-05-15 LAB — CBC WITH DIFFERENTIAL/PLATELET
Basophils Absolute: 0.1 10*3/uL (ref 0.0–0.1)
Basophils Relative: 1 %
Eosinophils Absolute: 0.3 10*3/uL (ref 0.0–0.7)
Eosinophils Relative: 3 %
HEMATOCRIT: 38.3 % — AB (ref 39.0–52.0)
Hemoglobin: 12.8 g/dL — ABNORMAL LOW (ref 13.0–17.0)
LYMPHS ABS: 2.2 10*3/uL (ref 0.7–4.0)
Lymphocytes Relative: 21 %
MCH: 30.9 pg (ref 26.0–34.0)
MCHC: 33.4 g/dL (ref 30.0–36.0)
MCV: 92.5 fL (ref 78.0–100.0)
MONO ABS: 0.7 10*3/uL (ref 0.1–1.0)
MONOS PCT: 7 %
NEUTROS ABS: 6.9 10*3/uL (ref 1.7–7.7)
Neutrophils Relative %: 68 %
Platelets: 320 10*3/uL (ref 150–400)
RBC: 4.14 MIL/uL — ABNORMAL LOW (ref 4.22–5.81)
RDW: 13.3 % (ref 11.5–15.5)
WBC: 10.2 10*3/uL (ref 4.0–10.5)

## 2017-05-15 LAB — BASIC METABOLIC PANEL
Anion gap: 10 (ref 5–15)
BUN: 15 mg/dL (ref 6–20)
CO2: 23 mmol/L (ref 22–32)
CREATININE: 1.15 mg/dL (ref 0.61–1.24)
Calcium: 9.2 mg/dL (ref 8.9–10.3)
Chloride: 108 mmol/L (ref 101–111)
GFR calc non Af Amer: >60 mL/min (ref >60)
Glucose, Bld: 110 mg/dL — ABNORMAL HIGH (ref 65–99)
POTASSIUM: 4.7 mmol/L (ref 3.5–5.1)
SODIUM: 141 mmol/L (ref 135–145)

## 2017-05-15 LAB — LIPID PANEL
CHOLESTEROL: 139 mg/dL (ref 0–200)
HDL: 40 mg/dL — ABNORMAL LOW (ref >40)
LDL Cholesterol: 71 mg/dL (ref 0–99)
TRIGLYCERIDES: 138 mg/dL (ref <150)
Total CHOL/HDL Ratio: 3.5 RATIO
VLDL: 28 mg/dL (ref 0–40)

## 2017-05-15 LAB — TYPE AND SCREEN
ABO/RH(D): O POS
ANTIBODY SCREEN: NEGATIVE

## 2017-05-15 LAB — ABO/RH: ABO/RH(D): O POS

## 2017-05-15 SURGERY — REPAIR, ARTERY, BRACHIAL
Anesthesia: General | Site: Arm Lower | Laterality: Left

## 2017-05-15 MED ORDER — DEXAMETHASONE SODIUM PHOSPHATE 10 MG/ML IJ SOLN
INTRAMUSCULAR | Status: DC | PRN
  Administered 2017-05-15: 10 mg via INTRAVENOUS

## 2017-05-15 MED ORDER — PROPOFOL 10 MG/ML IV BOLUS
INTRAVENOUS | Status: AC
  Filled 2017-05-15: qty 20

## 2017-05-15 MED ORDER — TETANUS-DIPHTH-ACELL PERTUSSIS 5-2.5-18.5 LF-MCG/0.5 IM SUSP
0.5000 mL | Freq: Once | INTRAMUSCULAR | Status: AC
  Administered 2017-05-15: 0.5 mL via INTRAMUSCULAR
  Filled 2017-05-15: qty 0.5

## 2017-05-15 MED ORDER — FENTANYL CITRATE (PF) 100 MCG/2ML IJ SOLN: 25.0000 ug | INTRAMUSCULAR | Status: DC | PRN

## 2017-05-15 MED ORDER — GLYCOPYRROLATE 0.2 MG/ML IV SOSY
PREFILLED_SYRINGE | INTRAVENOUS | Status: DC | PRN
  Administered 2017-05-15: .2 mg via INTRAVENOUS

## 2017-05-15 MED ORDER — LIDOCAINE-EPINEPHRINE 0.5 %-1:200000 IJ SOLN
INTRAMUSCULAR | Status: AC
  Filled 2017-05-15: qty 1

## 2017-05-15 MED ORDER — LACTATED RINGERS IV SOLN
INTRAVENOUS | Status: DC | PRN
  Administered 2017-05-15: 19:00:00 via INTRAVENOUS

## 2017-05-15 MED ORDER — SODIUM CHLORIDE 0.9 % IV SOLN
INTRAVENOUS | Status: DC | PRN
  Administered 2017-05-15: 500 mL

## 2017-05-15 MED ORDER — FENTANYL CITRATE (PF) 250 MCG/5ML IJ SOLN
INTRAMUSCULAR | Status: DC | PRN
  Administered 2017-05-15: 100 ug via INTRAVENOUS

## 2017-05-15 MED ORDER — FENTANYL CITRATE (PF) 250 MCG/5ML IJ SOLN
INTRAMUSCULAR | Status: AC
  Filled 2017-05-15: qty 5

## 2017-05-15 MED ORDER — PHENYLEPHRINE 40 MCG/ML (10ML) SYRINGE FOR IV PUSH (FOR BLOOD PRESSURE SUPPORT)
PREFILLED_SYRINGE | INTRAVENOUS | Status: DC | PRN
  Administered 2017-05-15: 60 ug via INTRAVENOUS
  Administered 2017-05-15: 80 ug via INTRAVENOUS
  Administered 2017-05-15: 120 ug via INTRAVENOUS

## 2017-05-15 MED ORDER — SUGAMMADEX SODIUM 200 MG/2ML IV SOLN
INTRAVENOUS | Status: DC | PRN
  Administered 2017-05-15: 200 mg via INTRAVENOUS

## 2017-05-15 MED ORDER — ROCURONIUM BROMIDE 10 MG/ML (PF) SYRINGE
PREFILLED_SYRINGE | INTRAVENOUS | Status: DC | PRN
  Administered 2017-05-15: 50 mg via INTRAVENOUS
  Administered 2017-05-15: 20 mg via INTRAVENOUS

## 2017-05-15 MED ORDER — FENTANYL CITRATE (PF) 100 MCG/2ML IJ SOLN
50.0000 ug | Freq: Once | INTRAMUSCULAR | Status: AC
  Administered 2017-05-15: 50 ug via INTRAVENOUS
  Filled 2017-05-15: qty 2

## 2017-05-15 MED ORDER — LIDOCAINE 2% (20 MG/ML) 5 ML SYRINGE
INTRAMUSCULAR | Status: DC | PRN
  Administered 2017-05-15: 80 mg via INTRAVENOUS

## 2017-05-15 MED ORDER — TRAMADOL HCL 50 MG PO TABS: 50.0000 mg | ORAL_TABLET | Freq: Four times a day (QID) | ORAL | 0 refills | Status: DC | PRN

## 2017-05-15 MED ORDER — SUCCINYLCHOLINE CHLORIDE 200 MG/10ML IV SOSY
PREFILLED_SYRINGE | INTRAVENOUS | Status: DC | PRN
  Administered 2017-05-15: 120 mg via INTRAVENOUS

## 2017-05-15 MED ORDER — SODIUM CHLORIDE 0.9 % IV SOLN
INTRAVENOUS | Status: AC
  Filled 2017-05-15: qty 1.2

## 2017-05-15 MED ORDER — ETOMIDATE 2 MG/ML IV SOLN
INTRAVENOUS | Status: DC | PRN
Start: 2017-05-15 — End: 2017-05-15
  Administered 2017-05-15: 14 mg via INTRAVENOUS

## 2017-05-15 MED ORDER — LACTATED RINGERS IV SOLN
INTRAVENOUS | Status: DC
  Administered 2017-05-15: 19:00:00 via INTRAVENOUS

## 2017-05-15 MED ORDER — ONDANSETRON HCL 4 MG/2ML IJ SOLN
INTRAMUSCULAR | Status: DC | PRN
  Administered 2017-05-15: 4 mg via INTRAVENOUS

## 2017-05-15 MED ORDER — CEFAZOLIN SODIUM-DEXTROSE 2-4 GM/100ML-% IV SOLN
INTRAVENOUS | Status: AC
  Filled 2017-05-15: qty 100

## 2017-05-15 MED ORDER — EPHEDRINE SULFATE-NACL 50-0.9 MG/10ML-% IV SOSY
PREFILLED_SYRINGE | INTRAVENOUS | Status: DC | PRN
  Administered 2017-05-15: 15 mg via INTRAVENOUS

## 2017-05-15 MED ORDER — SODIUM CHLORIDE 0.9 % IR SOLN
Status: DC | PRN
  Administered 2017-05-15: 1000 mL

## 2017-05-15 MED ORDER — CEFAZOLIN SODIUM-DEXTROSE 2-4 GM/100ML-% IV SOLN
2.0000 g | Freq: Once | INTRAVENOUS | Status: AC
  Administered 2017-05-15: 2 g via INTRAVENOUS

## 2017-05-15 MED ORDER — PROMETHAZINE HCL 25 MG/ML IJ SOLN: 6.2500 mg | INTRAMUSCULAR | Status: DC | PRN

## 2017-05-15 SURGICAL SUPPLY — 38 items
BNDG ESMARK 4X9 LF (GAUZE/BANDAGES/DRESSINGS) IMPLANT
CANISTER SUCT 3000ML PPV (MISCELLANEOUS) ×2 IMPLANT
CLIP LIGATING EXTRA MED SLVR (CLIP) ×2 IMPLANT
CLIP LIGATING EXTRA SM BLUE (MISCELLANEOUS) ×2 IMPLANT
CLIP VESOCCLUDE MED 6/CT (CLIP) ×2 IMPLANT
CUFF TOURNIQUET SINGLE 18IN (TOURNIQUET CUFF) ×2 IMPLANT
CUFF TOURNIQUET SINGLE 24IN (TOURNIQUET CUFF) IMPLANT
CUFF TOURNIQUET SINGLE 34IN LL (TOURNIQUET CUFF) IMPLANT
DECANTER SPIKE VIAL GLASS SM (MISCELLANEOUS) IMPLANT
DERMABOND ADVANCED (GAUZE/BANDAGES/DRESSINGS) ×1
DERMABOND ADVANCED .7 DNX12 (GAUZE/BANDAGES/DRESSINGS) ×1 IMPLANT
ELECT REM PT RETURN 9FT ADLT (ELECTROSURGICAL) ×2
ELECTRODE REM PT RTRN 9FT ADLT (ELECTROSURGICAL) ×1 IMPLANT
GAUZE SPONGE 4X4 12PLY STRL (GAUZE/BANDAGES/DRESSINGS) ×2 IMPLANT
GLOVE BIOGEL PI IND STRL 6.5 (GLOVE) ×1 IMPLANT
GLOVE BIOGEL PI IND STRL 7.0 (GLOVE) ×1 IMPLANT
GLOVE BIOGEL PI IND STRL 7.5 (GLOVE) ×1 IMPLANT
GLOVE BIOGEL PI INDICATOR 6.5 (GLOVE) ×1
GLOVE BIOGEL PI INDICATOR 7.0 (GLOVE) ×1
GLOVE BIOGEL PI INDICATOR 7.5 (GLOVE) ×1
GLOVE SS BIOGEL STRL SZ 7.5 (GLOVE) ×1 IMPLANT
GLOVE SUPERSENSE BIOGEL SZ 7.5 (GLOVE) ×1
GOWN STRL REUS W/ TWL LRG LVL3 (GOWN DISPOSABLE) ×3 IMPLANT
GOWN STRL REUS W/TWL LRG LVL3 (GOWN DISPOSABLE) ×3
KIT BASIN OR (CUSTOM PROCEDURE TRAY) ×2 IMPLANT
KIT TURNOVER KIT B (KITS) ×2 IMPLANT
NS IRRIG 1000ML POUR BTL (IV SOLUTION) ×2 IMPLANT
PACK CV ACCESS (CUSTOM PROCEDURE TRAY) ×2 IMPLANT
PAD ARMBOARD 7.5X6 YLW CONV (MISCELLANEOUS) ×4 IMPLANT
PAD CAST 4YDX4 CTTN HI CHSV (CAST SUPPLIES) ×1 IMPLANT
PADDING CAST COTTON 4X4 STRL (CAST SUPPLIES) ×1
STAPLER VISISTAT 35W (STAPLE) IMPLANT
SUT PROLENE 6 0 CC (SUTURE) ×2 IMPLANT
SUT VIC AB 3-0 SH 27 (SUTURE) ×1
SUT VIC AB 3-0 SH 27X BRD (SUTURE) ×1 IMPLANT
TOWEL GREEN STERILE (TOWEL DISPOSABLE) ×2 IMPLANT
UNDERPAD 30X30 (UNDERPADS AND DIAPERS) ×2 IMPLANT
WATER STERILE IRR 1000ML POUR (IV SOLUTION) ×2 IMPLANT

## 2017-05-15 NOTE — Discharge Instructions (Signed)
Skin glue to incision site.  No ointments, alcohol, peroxide needed.  Glue will peel up on its own in 10-14 days.  Okay to shower in 24 hours.  Monitor for signs of infection( fever over 101, redness, drainage).  Call for severe bleeding from site.

## 2017-05-15 NOTE — ED Provider Notes (Signed)
[facility] EMERGENCY DEPARTMENT Provider Note   CSN: [id number] Arrival date & time: 05/15/17  1743     History   Chief Complaint Chief Complaint  Patient presents with  . Extremity Laceration    HPI Carlos [name] is a 62 y.o. male.  The history is provided by the patient.  Laceration   The incident occurred 1 to 2 hours ago. The laceration is located on the left arm. The laceration is 3 cm in size. The laceration mechanism was a a clean knife. The pain is at a severity of 2/10. The pain is mild. The pain has been constant since onset. It is unknown if a foreign body is present. His tetanus status is out of date.    Past Medical History:  Diagnosis Date  . Bilateral edema of lower extremity   . Chronic systolic heart failure ([place]) 12/2014   echo 25-30%  . Fatigue   . Hypertension   . Mild right ventricular systolic dysfunction   . Moderate mitral regurgitation by prior echocardiogram 12/2014  . Moderate to severe pulmonary hypertension ([place]) 12/2014  . Moderate tricuspid regurgitation by prior echocardiogram 12/2014  . Pneumonia 123/16/2016  . Shortness of breath at rest   . Smoking   . Tobacco abuse     Patient Active Problem List   Diagnosis Date Noted  . Carotid bruit 07/13/2015  . Chronic systolic CHF (congestive heart failure) ([place]) 01/29/2015  . Smoking 01/29/2015  . Essential hypertension 01/29/2015    Past Surgical History:  Procedure Laterality Date  . arm surgery Right 1985  . CARDIAC CATHETERIZATION N/A 02/02/2015   Procedure: Right/Left Heart Cath and Coronary Angiography;  Surgeon: Larey Dresser, MD;  Location: [facility];  Service: Cardiovascular;  Laterality: N/A;  . LEG SURGERY Left 1980 and 1992        Home Medications    Prior to Admission medications   Medication Sig Start Date End Date Taking? Authorizing Provider  aspirin EC 81 MG tablet Take 1 tablet (81 mg total) by mouth every other day. 09/01/15  Yes  Larey Dresser, MD  atorvastatin (LIPITOR) 80 MG tablet Take 0.5 tablets (40 mg total) by mouth daily. Patient taking differently: Take 40 mg by mouth at bedtime.  04/13/16  Yes Larey Dresser, MD  carvedilol (COREG) 12.5 MG tablet TAKE 1 TABLET BY MOUTH TWICE DAILY WITH MEALS 03/29/17  Yes Bensimhon, Shaune Pascal, MD  sacubitril-valsartan (ENTRESTO) 97-103 MG Take 1 tablet by mouth 2 (two) times daily. 03/24/17  Yes Larey Dresser, MD  spironolactone (ALDACTONE) 25 MG tablet Take 0.5 tablets (12.5 mg total) by mouth daily. 04/13/16  Yes Larey Dresser, MD    Family History Family History  Problem Relation Age of Onset  . Heart disease Other        multiple family members  . Stroke Father   . Diabetes Mellitus II Father   . Heart attack Father   . Stroke Brother     Social History Social History   Tobacco Use  . Smoking status: Former Smoker    Last attempt to quit: 03/09/2015    Years since quitting: 2.1  . Smokeless tobacco: Never Used  Substance Use Topics  . Alcohol use: No    Alcohol/week: 0.0 oz  . Drug use: Not on file     Allergies   Patient has no known allergies.   Review of Systems Review of Systems  Constitutional: Negative for chills and  fever.  HENT: Negative for ear pain and sore throat.   Eyes: Negative for pain and visual disturbance.  Respiratory: Negative for cough and shortness of breath.   Cardiovascular: Negative for chest pain and palpitations.  Gastrointestinal: Negative for abdominal pain and vomiting.  Genitourinary: Negative for dysuria and hematuria.  Musculoskeletal: Negative for arthralgias and back pain.  Skin: Positive for wound (patient states blood was shooting out). Negative for color change and rash.  Neurological: Positive for numbness. Negative for seizures and syncope.  All other systems reviewed and are negative.    Physical Exam Updated Vital Signs  ED Triage Vitals [05/15/17 1752]  Enc Vitals Group     BP (!) 172/80      Pulse Rate 86     Resp 18     Temp      Temp src      SpO2 97 %     Weight      Height      Head Circumference      Peak Flow      Pain Score      Pain Loc      Pain Edu?      Excl. in [place]?    \ Physical Exam  Constitutional: He appears well-developed and well-nourished.  HENT:  Head: Normocephalic and atraumatic.  Eyes: Pupils are equal, round, and reactive to light. Conjunctivae and EOM are normal.  Neck: Normal range of motion. Neck supple.  Cardiovascular: Normal rate, regular rhythm and normal heart sounds.  No murmur heard. Pulmonary/Chest: Effort normal and breath sounds normal. No respiratory distress.  Abdominal: Soft. There is no tenderness.  Musculoskeletal: Normal range of motion. He exhibits no edema.  Neurological: He is alert.  Moves all fingers in right hand  Skin: Skin is warm and dry. Capillary refill takes less than 2 seconds.  1-2 cm laceration to volar left wrist with pulsatile bleeding from it over the radial artery  Psychiatric: He has a normal mood and affect.  Nursing note and vitals reviewed.    ED Treatments / Results  Labs (all labs ordered are listed, but only abnormal results are displayed) Labs Reviewed  CBC WITH DIFFERENTIAL/PLATELET - Abnormal; Notable for the following components:      Result Value   RBC 4.14 (*)    Hemoglobin 12.8 (*)    HCT 38.3 (*)    All other components within normal limits  TYPE AND SCREEN  ABO/RH    EKG None  Radiology Dg Wrist Complete Left  Result Date: 05/15/2017 CLINICAL DATA:  Laceration EXAM: LEFT WRIST - COMPLETE 3+ VIEW COMPARISON:  None FINDINGS: Bandage material limits bone detail. No definite acute displaced fracture or malalignment is seen. No radiopaque foreign body in the soft tissues. IMPRESSION: No definite acute osseous abnormality. Electronically Signed   By: Donavan Foil M.D.   On: 05/15/2017 18:19    Procedures Procedures (including critical care time)  Medications Ordered in  ED Medications  lactated ringers infusion (has no administration in time range)  Tdap (BOOSTRIX) injection 0.5 mL (0.5 mLs Intramuscular Given 05/15/17 1754)  fentaNYL (SUBLIMAZE) injection 50 mcg (50 mcg Intravenous Given 05/15/17 1843)     Initial Impression / Assessment and Plan / ED Course  I have reviewed the triage vital signs and the nursing notes.  Pertinent labs & imaging results that were available during my care of the patient were reviewed by me and considered in my medical decision making (see chart for details).  Carlos [name] is a 62 year old male with history of heart failure, hypertension who presents to the ED with left wrist laceration.  Patient with normal vitals upon arrival.  Patient had tourniquet placed by EMS about 90 minutes ago.  Patient was unpacking boxes with a knife and accidentally cut himself in the left wrist.  Patient immediately states that he had pulsatile blood coming from the left wrist and tried to tie a tourniquet and wrapped with gauze bandage around it.  EMS at the scene was able to place a tourniquet.  Were able to achieve hemostasis with tight wrap as well.  Upon arrival tourniquet was taken down and there appears to be about a 1 to 2 cm laceration over the left radial artery that has pulsatile bleeding and concern for radial artery injury.  Patient appears to have normal motor function throughout the right hand.  Vascular surgery, Dr. Donnetta Hutching was consulted and they will take the patient directly to the OR.  Patient with basic labs drawn that were unremarkable.  Hemoglobin 12.8.  Patient had normal blood pressure throughout my care.  No other signs of injury on exam.  X-ray of the left wrist did not show any fractures.  Patient was given tetanus shot.  Type and screen also sent for surgical purposes.  Tourniquet was left in place as patient was transported to the OR.  Final Clinical Impressions(s) / ED Diagnoses   Final diagnoses:  Wrist laceration,  left, initial encounter  Injury of left radial artery, initial encounter    ED Discharge Orders    None       Lennice Sites, DO 05/15/17 1909    Noemi Chapel, MD 05/16/17 [phone]

## 2017-05-15 NOTE — ED Notes (Signed)
PAGED VASCULAR -*2

## 2017-05-15 NOTE — Anesthesia Preprocedure Evaluation (Addendum)
Anesthesia Evaluation  Patient identified by MRN, date of birth, ID band Patient awake    Reviewed: Allergy & Precautions, NPO status , Patient's Chart, lab work & pertinent test results, reviewed documented beta blocker date and time   History of Anesthesia Complications Negative for: history of anesthetic complications  Airway Mallampati: I  TM Distance: >3 FB Neck ROM: Full    Dental  (+) Edentulous Upper, Partial Lower, Dental Advisory Given   Pulmonary former smoker,    Pulmonary exam normal        Cardiovascular hypertension, Pt. on medications and Pt. on home beta blockers +CHF  Normal cardiovascular exam+ Valvular Problems/Murmurs MR   echo 25-30% Moderate mitral regurgitation by prior echocardiogram 12/2014  Conclusion   1. Mildly elevated right and left heart filling pressures, primarily pulmonary venous hypertension.  2. Low cardiac output but not markedly low.  3. Nonobstructive mild CAD.      Neuro/Psych negative psych ROS   GI/Hepatic negative GI ROS, Neg liver ROS,   Endo/Other  negative endocrine ROS  Renal/GU negative Renal ROS     Musculoskeletal   Abdominal   Peds  Hematology   Anesthesia Other Findings   Reproductive/Obstetrics                           Anesthesia Physical Anesthesia Plan  ASA: III and emergent  Anesthesia Plan: General   Post-op Pain Management:    Induction: Intravenous, Rapid sequence and Cricoid pressure planned  PONV Risk Score and Plan: 2 and Ondansetron and Dexamethasone  Airway Management Planned: Oral ETT  Additional Equipment:   Intra-op Plan:   Post-operative Plan: Extubation in OR  Informed Consent: I have reviewed the patients History and Physical, chart, labs and discussed the procedure including the risks, benefits and alternatives for the proposed anesthesia with the patient or authorized representative who has indicated  his/her understanding and acceptance.   Dental advisory given  Plan Discussed with: Anesthesiologist, CRNA and Surgeon  Anesthesia Plan Comments:        Anesthesia Quick Evaluation

## 2017-05-15 NOTE — Anesthesia Postprocedure Evaluation (Signed)
Anesthesia Post Note  Patient: Carlos [name]  Procedure(s) Performed: REPAIR LEFT RADIAL ARTERY (Left Arm Lower)     Patient location during evaluation: PACU Anesthesia Type: General Level of consciousness: sedated Pain management: pain level controlled Vital Signs Assessment: post-procedure vital signs reviewed and stable Respiratory status: spontaneous breathing and respiratory function stable Cardiovascular status: stable Postop Assessment: no apparent nausea or vomiting Anesthetic complications: no    Last Vitals:  Vitals:   05/15/17 2030 05/15/17 2115  BP: (!) 158/73   Pulse: 90   Resp: 12   Temp:  36.7 C  SpO2: 100%     Last Pain: There were no vitals filed for this visit.               Coye Dawood DANIEL

## 2017-05-15 NOTE — ED Notes (Signed)
Automated tourniquet set to 190, per Dr. Sabra Heck.

## 2017-05-15 NOTE — ED Notes (Signed)
PAGED VASCULAR

## 2017-05-15 NOTE — Anesthesia Procedure Notes (Signed)
Procedure Name: Intubation Date/Time: 05/15/2017 7:50 PM Performed by: Teressa Lower., CRNA Pre-anesthesia Checklist: Patient identified, Emergency Drugs available, Suction available and Patient being monitored Patient Re-evaluated:Patient Re-evaluated prior to induction Oxygen Delivery Method: Circle system utilized Preoxygenation: Pre-oxygenation with 100% oxygen Induction Type: IV induction, Rapid sequence and Cricoid Pressure applied Ventilation: Mask ventilation without difficulty Laryngoscope Size: Mac and 3 Grade View: Grade I Tube type: Oral Tube size: 7.5 mm Number of attempts: 1 Airway Equipment and Method: Stylet Placement Confirmation: ETT inserted through vocal cords under direct vision,  positive ETCO2 and breath sounds checked- equal and bilateral Secured at: 22 cm Tube secured with: Tape Dental Injury: Teeth and Oropharynx as per pre-operative assessment

## 2017-05-15 NOTE — Progress Notes (Signed)
Patient ID: Carlos [name], male   DOB: 1955/02/28, 62 y.o.   MRN: [id number]    Advanced Heart Failure Clinic Note   PCP: Dr. Venetia Maxon [facility]) Cardiology: Dr. Aundra Dubin  62 y.o. with minimal past history presents for followup of cardiomyopathy.  Patient had minimal medical care up until 12/16.  He had not seen a doctor for years.  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  Around Thanksgiving of 2016, he developed a cough and exertional shortness of breath.  He went to an urgent care and had a CXR that was suggestive of PNA, also showed cardiomegaly.  He did not improve with antibiotics.  Dyspnea became progressively worse and he developed ankle edema.  At this point, he went to see Dr Venetia Maxon in [place].  He was found to be hypertensive and was started on Coreg and losartan.  Atorvastatin was started for elevated lipids.  He was sent for an echo at the hospital in [place], [place] was 25-30%. He was therefore referred here.  Right and left heart cath showed minimal CAD, reasonably optimized filling pressures on Lasix.  Cardiac index was low but not markedly so.   No smoking since April 16, 2015 with presume throat cancer. Had biopsy of throat mass 04/17/15 at [place] showing invasive poorly differentiated squamous cell carcinoma, with 1 lymph node positive. Had surgical removal in 04/2015. No chemo or radiation required.   Cardiac MRI was done in 8/17.  EF up to 63% with normal RV.  No late gadolinium enhancement.   He continues to take all his cardiac meds and seems to be doing well.  He is working full time loading trucks.  BP is elevated today but he had some difficulty finding a parking space and thinks it is due to stress.  No exertional dyspnea at this point.  No chest pain. No orthopnea/PND. Occasional tightness in his calves if he walks fast up hill, no change from the past.     Labs (12/16): K  4.5, creatinine 1.21, LFTs normal Labs (1/17): K 4.8, creatinine 1.13 Labs (2/17): K 5.1 => 4.4, creatinine 1.69 => 1.1 Labs (4/17): K 4.2, Creatinine 1.12 Labs (7/17): K 4.3, creatinine 1.28 Labs (8/17): LDL 58, HDL 34 Labs (9/17): K 4.7, creatinine 1.44 Labs (3/18): K 4.5, creatinine 1.19  ECG (personally reviewed): NSR at 50, normal  PMH: 1. Hyperlipidemia 2. HTN 3. Prior smoker.  4. Chronic systolic CHF: Nonischemic cardiomyopathy.  - Echo (12/16) with EF 25-30%, restrictive diastolic function, mildly enlarged RV with mildly decreased systolic function, moderate MR, moderate TR, PASP 67 mmHg.  - LHC/RHC (1/17): Mild, nonobstructive CAD; mean RA 7, PA 42/16 mean 26, mean PCWP 17, CI 2.14, PVR 2.52 WU. - Cardiac MRI (8/17): EF 63%, normal RV size and systolic function, no late gadolinium enhancement.  5. Throat cancer: S/p surgery at [facility] in 2017.  6. Carotid stenosis: Carotid dopplers 7/17 with 40-59% RICA stenosis.  - Carotid dopplers (7/18): 40-59% BICA  SH: Lives in [place] (near [place]), quit smoking 2/17, no ETOH, drives garbage truck.   FH: No premature CAD, no cardiomyopathy.   ROS: All systems reviewed and negative except as per HPI.   Current Outpatient Medications  Medication Sig Dispense Refill  . aspirin EC 81 MG tablet Take 1 tablet (81 mg total) by mouth every other day. 15 tablet  3  . atorvastatin (LIPITOR) 80 MG tablet Take 0.5 tablets (40 mg total) by mouth daily. (Patient taking differently: Take 40 mg by mouth at bedtime. ) 15 tablet 3  . carvedilol (COREG) 12.5 MG tablet TAKE 1 TABLET BY MOUTH TWICE DAILY WITH MEALS 60 tablet 3  . sacubitril-valsartan (ENTRESTO) 97-103 MG Take 1 tablet by mouth 2 (two) times daily. 180 tablet 3  . spironolactone (ALDACTONE) 25 MG tablet Take 0.5 tablets (12.5 mg total) by mouth daily. 90 tablet 3  . traMADol (ULTRAM) 50 MG tablet Take 1 tablet (50 mg total) by mouth every 6 (six) hours as needed. 10 tablet 0   No  current facility-administered medications for this encounter.    Facility-Administered Medications Ordered in Other Encounters  Medication Dose Route Frequency Provider Last Rate Last Dose  . ceFAZolin (ANCEF) 2-4 GM/100ML-% IVPB           . fentaNYL (SUBLIMAZE) injection 25-50 mcg  25-50 mcg Intravenous Q5 min PRN Duane Boston, MD      . lactated ringers infusion   Intravenous Continuous Duane Boston, MD 10 mL/hr at 05/15/17 1911    . promethazine (PHENERGAN) injection 6.25-12.5 mg  6.25-12.5 mg Intravenous Q15 min PRN Duane Boston, MD       BP (!) 146/90   Pulse (!) 54   Wt 177 lb 8 oz (80.5 kg)   SpO2 98%   BMI 30.47 kg/m    Wt Readings from Last 3 Encounters:  05/15/17 177 lb 8 oz (80.5 kg)  04/13/16 178 lb 8 oz (81 kg)  10/14/15 173 lb (78.5 kg)   General: NAD Neck: No JVD, no thyromegaly or thyroid nodule.  Lungs: Clear to auscultation bilaterally with normal respiratory effort. CV: Nondisplaced PMI.  Heart regular S1/S2, no S3/S4, no murmur.  No peripheral edema.  Right carotid bruit.  Difficult to palpate pedal pulses.  Abdomen: Soft, nontender, no hepatosplenomegaly, no distention.  Skin: Intact without lesions or rashes.  Neurologic: Alert and oriented x 3.  Psych: Normal affect. Extremities: No clubbing or cyanosis.  HEENT: Normal.    Assessment/Plan: 1. Chronic systolic CHF: Nonischemic cardiomyopathy, EF 25-30% on 12/16 echo.  LHC/RHC (1/17) showed mild nonobstructive CAD and near-normal filling pressures on Lasix.  Possible viral myocarditis: episode seemed to start with an infectious event (possible PNA).  Also, must consider hypertensive cardiomyopathy with possibly years of untreated hypertension.  However, he does not have much LVH on his ECG.  He was not a heavy drinker.  Cardiac MRI in 8/17 showed improvement in EF to 63% with no late gadolinium enhancement.  NYHA class I, not volume overloaded on exam.  - Continue Coreg 12.5 mg bid  - Continue Entresto  97/103 bid.    - He will continue spironolactone.  - BMET today and will need BMET every 3 months on his current meds.  - Repeat echo in 7/19 to make sure that EF remains normal (will do at time of repeat carotid dopplers).     2. HTN: Mildly elevated but just walked in and stressed.  Will continue current meds.    3. Hyperlipidemia: Continue atorvastatin 40 mg daily. Check lipids today.  Goal LDL < 70 with vascular disease (carotid stenosis).  4. Smoking:  Congratulated on his continued abstinence.  5. Carotid stenosis: Check carotid dopplers in 7/19.  Needs to continue statin and ASA 81 daily.  6. Calf pain: Calf pain with heavy exertion may be claudication as it is difficult to palpate his  pedal pulses.  As symptoms are minimal/unchanged and he is on ASA and statin already, no further workup necessary at this time.   Followup in 1 year if EF remains normal on 7/19 echo.    [name] 05/15/2017
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
## 2017-05-15 NOTE — Patient Instructions (Signed)
Labs today  Labs every 3 months at your primary care doctor's office  Your physician has requested that you have an echocardiogram. Echocardiography is a painless test that uses sound waves to create images of your heart. It provides your doctor with information about the size and shape of your heart and how well your heart's chambers and valves are working. This procedure takes approximately one hour. There are no restrictions for this procedure.  IN July  Your physician has requested that you have a carotid duplex. This test is an ultrasound of the carotid arteries in your neck. It looks at blood flow through these arteries that supply the brain with blood. Allow one hour for this exam. There are no restrictions or special instructions.  IN July  We will contact you in 1 year to schedule your next appointment.
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
## 2017-05-15 NOTE — Transfer of Care (Signed)
Immediate Anesthesia Transfer of Care Note  Patient: Carlos [name]  Procedure(s) Performed: REPAIR LEFT RADIAL ARTERY (Left Arm Lower)  Patient Location: PACU  Anesthesia Type:General  Level of Consciousness: awake, alert  and oriented  Airway & Oxygen Therapy: Patient Spontanous Breathing and Patient connected to nasal cannula oxygen  Post-op Assessment: Report given to RN and Post -op Vital signs reviewed and stable  Post vital signs: Reviewed and stable  Last Vitals:  Vitals Value Taken Time  BP 158/73 05/15/2017  8:30 PM  Temp    Pulse 84 05/15/2017  8:31 PM  Resp 14 05/15/2017  8:31 PM  SpO2 100 % 05/15/2017  8:31 PM  Vitals shown include unvalidated device data.  Last Pain: There were no vitals filed for this visit.       Complications: No apparent anesthesia complications

## 2017-05-15 NOTE — ED Notes (Signed)
Belongings given to family.
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
## 2017-05-16 ENCOUNTER — Telehealth: Payer: Self-pay | Admitting: Vascular Surgery

## 2017-05-16 ENCOUNTER — Encounter (HOSPITAL_COMMUNITY): Payer: Self-pay | Admitting: Vascular Surgery

## 2017-05-16 NOTE — Telephone Encounter (Signed)
Sched apt 05/30/17 at 2:45. Lm on hm# to inform pt of appt.

## 2017-05-16 NOTE — Telephone Encounter (Signed)
-----   Message from Mena Goes, RN sent at 05/16/2017  9:21 AM EDT ----- Regarding: 2 weeks with TFE, postop sutures   ----- Message ----- From: Rosetta Posner, MD Sent: 05/15/2017   8:47 PM To: [facility] Clinical Pool, [facility] Charge Pool  Repair of transected radial artery with end-to-end anastomosis assistant nurse.  The patient will be discharged this evening.  Needs an office visit with me in 2 weeks

## 2017-05-19 NOTE — Telephone Encounter (Signed)
User: Cherie Dark A Date/time: 05/16/17 11:19 AM  Comment: Called pt back and lmsg for him to CB.Vassie Moment  Context:  Outcome: Left Message  Phone number: [phone] Phone Type: Home Phone  Comm. type: Telephone Call type: Outgoing  Contact: Carlos [name] Relation to patient: Self    User: Cherie Dark A Date/time: 05/16/17 10:13 AM  Comment: Returned patient's call to sch him for test..RG  Context:  Outcome: Left Message  Phone number: [phone] Phone Type: Home Phone  Comm. type: Telephone Call type: Outgoing  Contact: [name], Carlos Relation to patient: Self    User: Cherie Dark A Date/time: 05/15/17 2:56 PM  Comment: Called pt and lmsg for him to CB to sch test..RG  Context:  Outcome: Left Message  Phone number: [phone] Phone Type: Home Phone  Comm. type: Telephone Call type: Outgoing  Contact: [name], Carlos Relation to patient: Self

## 2017-05-30 ENCOUNTER — Other Ambulatory Visit: Payer: Self-pay

## 2017-05-30 ENCOUNTER — Encounter: Payer: Self-pay | Admitting: Vascular Surgery

## 2017-05-30 ENCOUNTER — Encounter: Payer: Self-pay | Admitting: Physician Assistant

## 2017-05-30 ENCOUNTER — Ambulatory Visit (INDEPENDENT_AMBULATORY_CARE_PROVIDER_SITE_OTHER): Payer: Self-pay | Admitting: Vascular Surgery

## 2017-05-30 DIAGNOSIS — S55102D Unspecified injury of radial artery at forearm level, left arm, subsequent encounter: Secondary | ICD-10-CM

## 2017-05-30 DIAGNOSIS — C321 Malignant neoplasm of supraglottis: Secondary | ICD-10-CM | POA: Diagnosis not present

## 2017-05-30 DIAGNOSIS — S55109A Unspecified injury of radial artery at forearm level, unspecified arm, initial encounter: Secondary | ICD-10-CM | POA: Insufficient documentation

## 2017-05-30 DIAGNOSIS — Z8521 Personal history of malignant neoplasm of larynx: Secondary | ICD-10-CM | POA: Diagnosis not present

## 2017-05-30 DIAGNOSIS — Z87891 Personal history of nicotine dependence: Secondary | ICD-10-CM | POA: Diagnosis not present

## 2017-05-30 DIAGNOSIS — Z9002 Acquired absence of larynx: Secondary | ICD-10-CM | POA: Diagnosis not present

## 2017-05-30 DIAGNOSIS — S6422XD Injury of radial nerve at wrist and hand level of left arm, subsequent encounter: Secondary | ICD-10-CM

## 2017-05-30 DIAGNOSIS — S5420XA Injury of radial nerve at forearm level, unspecified arm, initial encounter: Secondary | ICD-10-CM | POA: Insufficient documentation
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
## 2017-07-10 DIAGNOSIS — E785 Hyperlipidemia, unspecified: Secondary | ICD-10-CM | POA: Diagnosis not present

## 2017-07-10 DIAGNOSIS — C321 Malignant neoplasm of supraglottis: Secondary | ICD-10-CM | POA: Diagnosis not present

## 2017-07-10 DIAGNOSIS — Z1159 Encounter for screening for other viral diseases: Secondary | ICD-10-CM | POA: Diagnosis not present

## 2017-07-10 DIAGNOSIS — I5022 Chronic systolic (congestive) heart failure: Secondary | ICD-10-CM | POA: Diagnosis not present

## 2017-07-14 ENCOUNTER — Other Ambulatory Visit (HOSPITAL_COMMUNITY): Payer: Self-pay | Admitting: Cardiology

## 2017-07-31 ENCOUNTER — Other Ambulatory Visit (HOSPITAL_COMMUNITY): Payer: Self-pay | Admitting: Internal Medicine

## 2017-08-04 ENCOUNTER — Ambulatory Visit (HOSPITAL_BASED_OUTPATIENT_CLINIC_OR_DEPARTMENT_OTHER): Payer: 59

## 2017-08-04 ENCOUNTER — Ambulatory Visit (HOSPITAL_COMMUNITY)
Admission: RE | Admit: 2017-08-04 | Discharge: 2017-08-04 | Disposition: A | Discharge status: Home / Self Care | Payer: 59 | Source: Ambulatory Visit | Attending: Cardiology | Admitting: Cardiology

## 2017-08-04 ENCOUNTER — Other Ambulatory Visit: Payer: Self-pay

## 2017-08-04 DIAGNOSIS — I5022 Chronic systolic (congestive) heart failure: Secondary | ICD-10-CM

## 2017-08-04 DIAGNOSIS — I6523 Occlusion and stenosis of bilateral carotid arteries: Secondary | ICD-10-CM

## 2017-10-31 DIAGNOSIS — Z8521 Personal history of malignant neoplasm of larynx: Secondary | ICD-10-CM | POA: Diagnosis not present

## 2017-10-31 DIAGNOSIS — Z08 Encounter for follow-up examination after completed treatment for malignant neoplasm: Secondary | ICD-10-CM | POA: Diagnosis not present

## 2017-11-03 IMAGING — MR MR CARD MORPHOLOGY WO/W CM
9 of 10 series · 38 of 40 positions shown · IV contrast (multihance)
Comparison: none

CLINICAL DATA: Cardiomyopathy of uncertain etiology

EXAM:
CARDIAC MRI
TECHNIQUE: The patient was scanned on a 1.5 Tesla GE magnet. A dedicated
cardiac coil was used. Functional imaging was done using Fiesta
sequences. [DATE], and 4 chamber views were done to assess for RWMA's.
Modified Sambritchi rule using a short axis stack was used to
calculate an ejection fraction on a dedicated work station using
Circle software. The patient received 23 cc of Multihance. After 10
minutes inversion recovery sequences were used to assess for
infiltration and scar tissue.
CONTRAST:  23 cc Multihance

[Series 3: bSSFP · sagittal · 8.0mm · 1.37mm/px · 1 of 14 slices shown (1 of 4)]
[im 1/14]
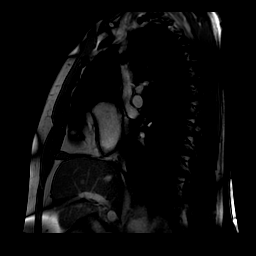

[Series 5: bSSFP · axial · 8.0mm · 1.37mm/px · 1 of 20 slices shown (2 of 4)]
[im 1/20]
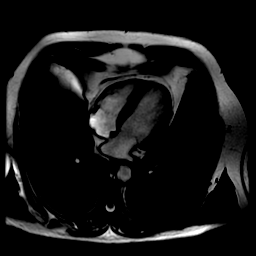

[Series 6: bSSFP · oblique · 8.0mm · 1.25mm/px · 20 of 300 slices shown (3 of 4)]
[im 1/300]
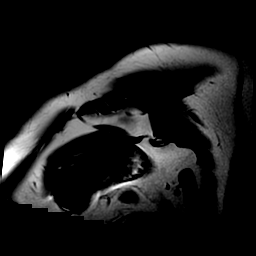
[im 16/300]
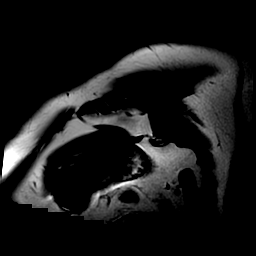
[im 32/300]
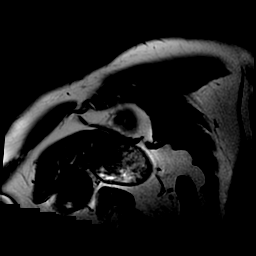
[im 48/300]
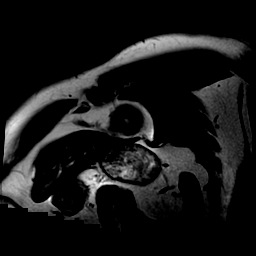
[im 63/300]
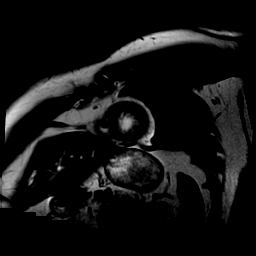
[im 79/300]
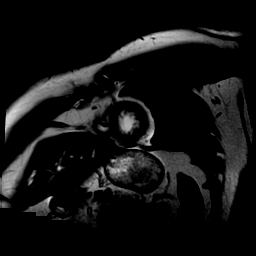
[im 95/300]
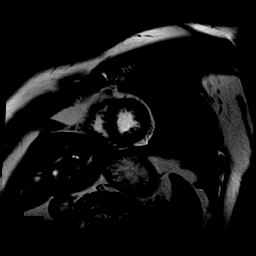
[im 111/300]
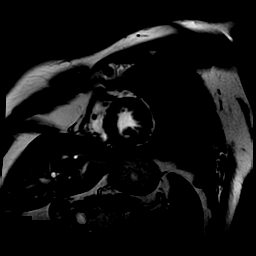
[im 126/300]
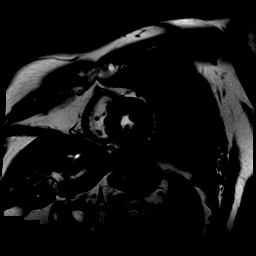
[im 142/300]
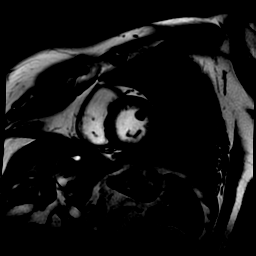
[im 158/300]
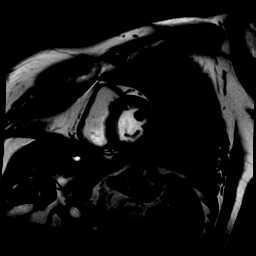
[im 174/300]
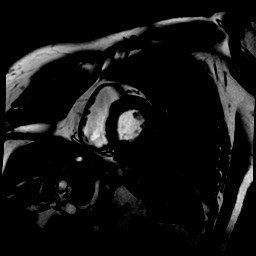
[im 189/300]
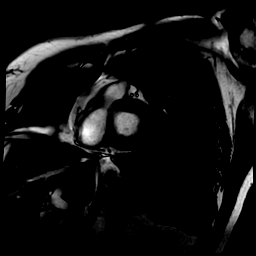
[im 205/300]
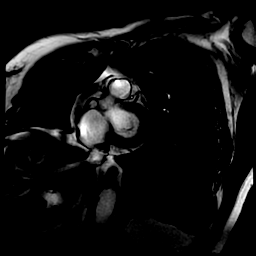
[im 221/300]
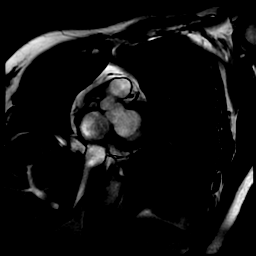
[im 237/300]
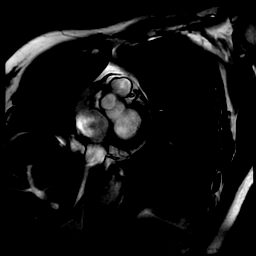
[im 252/300]
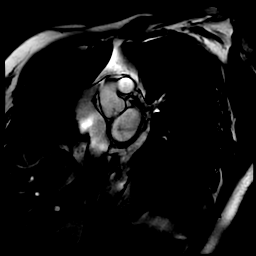
[im 268/300]
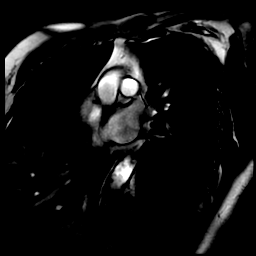
[im 284/300]
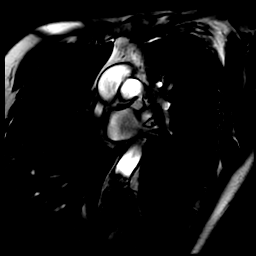
[im 300/300]
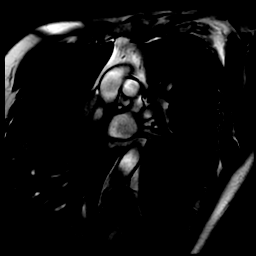

[Series 7: tags grid rad · axial · 8.0mm · 1.37mm/px · z∈[-110,+108]mm · 4 of 60 slices shown]
[im 1/60]
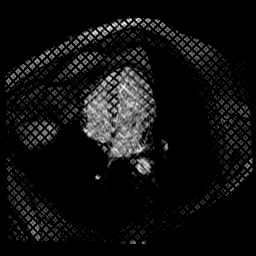
[im 20/60]
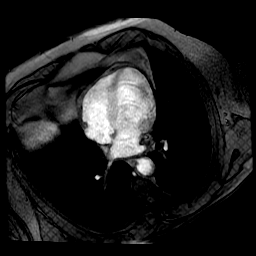
[im 40/60]
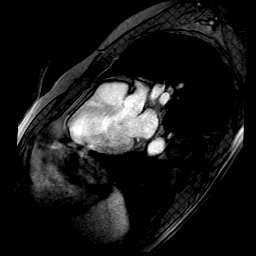
[im 60/60]
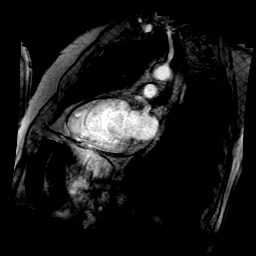

[Series 10: tags grid sa · oblique · 8.0mm · 1.37mm/px · 4 of 60 slices shown]
[im 1/60]
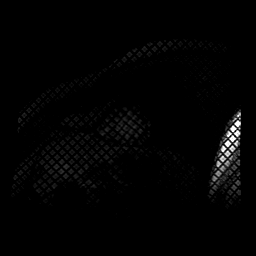
[im 20/60]
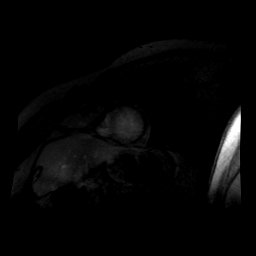
[im 40/60]
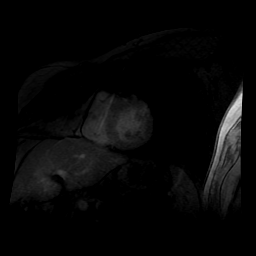
[im 60/60]
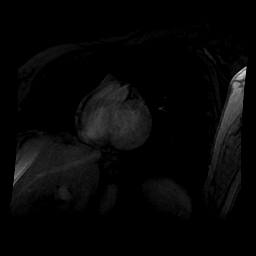

[Series 11: bSSFP · axial · 8.0mm · 1.37mm/px · z∈[-110,+108]mm · 4 of 60 slices shown (4 of 4)]
[im 1/60]
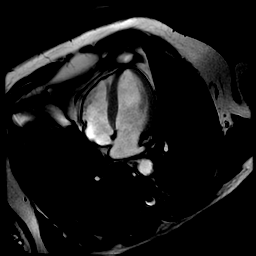
[im 20/60]
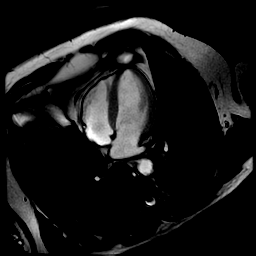
[im 40/60]
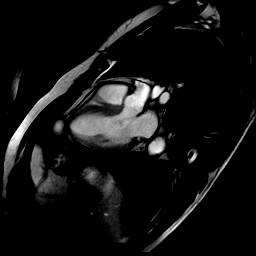
[im 60/60]
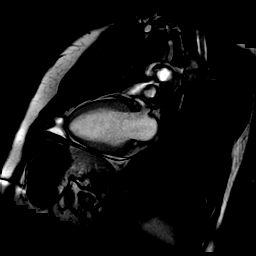

[Series 12: cine ir · oblique · 8.0mm · 1.41mm/px · 2 of 30 slices shown]
[im 1/30]
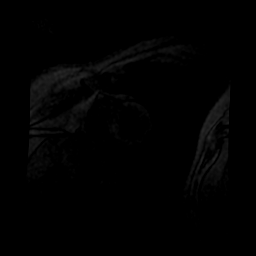
[im 30/30]
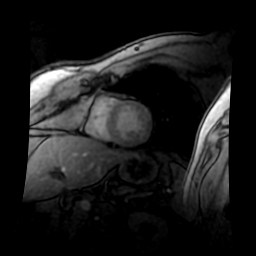

[Series 14: delayed ir prep · oblique · 8.0mm · 1.41mm/px · 1 of 12 slices shown]
[im 1/12]
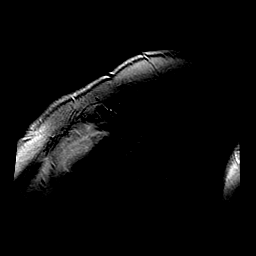

[Series 15: rad mde · axial · 8.0mm · 1.37mm/px · 1 of 5 slices shown]
[im 1/5]
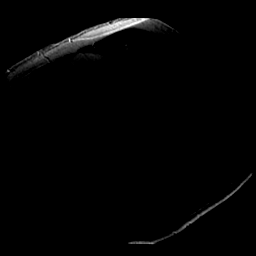

[38 of 40 positions shown; findings below may reference images not displayed]

FINDINGS: Limited images of lung fields show no gross abnormalities.

Normal left ventricular size and systolic function, EF 63%. Normal
wall motion. Mild LV hypertrophy. Normal right ventricular size and
systolic function. Trileaflet aortic valve with no stenosis and
regurgitation. No significant mitral regurgitation. Normal atrial
sizes.

On delayed enhancement imaging, there is no myocardial late
gadolinium enhancement (LGE).

MEASUREMENTS:
MEASUREMENTS
LVEDV 98 mL

LVSV 62 mL

LVEF 63%
IMPRESSION: 1. Normal LV size and systolic function, EF 63%. Mild LV
hypertrophy.

2.  Normal RV size and systolic function.

3. No LGE, so no evidence for prior MI, myocarditis, or infiltrative
disease.

[place]

## 2018-01-27 ENCOUNTER — Other Ambulatory Visit (HOSPITAL_COMMUNITY): Payer: Self-pay | Admitting: Internal Medicine

## 2018-02-12 ENCOUNTER — Other Ambulatory Visit (HOSPITAL_COMMUNITY): Payer: Self-pay | Admitting: Internal Medicine

## 2018-02-26 ENCOUNTER — Other Ambulatory Visit (HOSPITAL_COMMUNITY): Payer: Self-pay

## 2018-02-26 MED ORDER — SACUBITRIL-VALSARTAN 97-103 MG PO TABS: 1.0000 | ORAL_TABLET | Freq: Two times a day (BID) | ORAL | 0 refills | Status: DC

## 2018-04-28 ENCOUNTER — Other Ambulatory Visit (HOSPITAL_COMMUNITY): Payer: Self-pay | Admitting: Internal Medicine

## 2018-05-07 ENCOUNTER — Other Ambulatory Visit: Payer: Self-pay

## 2018-05-07 ENCOUNTER — Ambulatory Visit (HOSPITAL_COMMUNITY)
Admission: RE | Admit: 2018-05-07 | Discharge: 2018-05-07 | Disposition: A | Discharge status: Home / Self Care | Payer: 59 | Source: Ambulatory Visit | Attending: Cardiology | Admitting: Cardiology

## 2018-05-07 ENCOUNTER — Encounter (HOSPITAL_COMMUNITY): Payer: Self-pay

## 2018-05-07 DIAGNOSIS — I5022 Chronic systolic (congestive) heart failure: Secondary | ICD-10-CM | POA: Diagnosis not present

## 2018-05-07 MED ORDER — SACUBITRIL-VALSARTAN 97-103 MG PO TABS: 1.0000 | ORAL_TABLET | Freq: Two times a day (BID) | ORAL | 0 refills | Status: DC

## 2018-05-07 NOTE — Patient Instructions (Addendum)
Your physician wants you to follow-up in: 1 YEAR. You will receive a reminder letter in the mail two months in advance. If you don't receive a letter, please call our office to schedule the follow-up appointment. [phone]  Carlos [name] was refilled.  You have been referred to complete a Carotid Vascular Studies, this office will call you schedule an appointment with.   Please get lab work drawn at your primary care doctor or closest [place], there is a prescription included. Please have them fax results to our office for review at [phone]

## 2018-05-07 NOTE — Progress Notes (Signed)
Heart Failure TeleHealth Note  Due to national recommendations of social distancing due to [place] 19, Audio/video telehealth visit is felt to be most appropriate for this patient at this time.  See MyChart message from today for patient consent regarding telehealth for [facility]  Date:  05/07/2018   ID:  Carlos [name], DOB [birth year], MRN [id number]  Location: Home  Provider location: [place] [facility] Type of Visit: Established patient   PCP:  Street, Sharon Mt, MD  Cardiologist:  Dr. Aundra Dubin  Chief Complaint: Fatigue   History of Present Illness: Carlos [name] is a 63 y.o. male who presents via audio/video conferencing for a telehealth visit today.   We attempted to do a video visit but connection was poor so we used landline for telephone visit.   he denies symptoms worrisome for COVID 19.   Patient had minimal medical care up until 12/16.  He had not seen a doctor for years.  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  Around Thanksgiving of 2016, he developed a cough and exertional shortness of breath.  He went to an urgent care and had a CXR that was suggestive of PNA, also showed cardiomegaly.  He did not improve with antibiotics.  Dyspnea became progressively worse and he developed ankle edema.  At this point, he went to see Dr Venetia Maxon in [place].  He was found to be hypertensive and was started on Coreg and losartan.  Atorvastatin was started for elevated lipids.  He was sent for an echo at the hospital in [place], [place] was 25-30%. He was therefore referred here.  Right and left heart cath showed minimal CAD, reasonably optimized filling pressures on Lasix.  Cardiac index was low but not markedly so.   No smoking since April 16, 2015 with presume throat cancer. Had biopsy of throat mass 04/17/15 at [facility] showing invasive poorly differentiated squamous  cell carcinoma, with 1 lymph node positive. Had surgical removal in 04/2015. No chemo or radiation required.   Cardiac MRI was done in 8/17.  EF up to 63% with normal RV.  No late gadolinium enhancement.  Echo was done in 7/19, showing that EF remained normal at 60-65%.   He continues to take all his cardiac meds and seems to be doing well.  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  No exertional dyspnea or chest pain.  No orthopnea/PND.  No palpitations or lightheadedness. He is not smoking.    Labs (12/16): K 4.5, creatinine 1.21, LFTs normal Labs (1/17): K 4.8, creatinine 1.13 Labs (2/17): K 5.1 => 4.4, creatinine 1.69 => 1.1 Labs (4/17): K 4.2, Creatinine 1.12 Labs (7/17): K 4.3, creatinine 1.28 Labs (8/17): LDL 58, HDL 34 Labs (9/17): K 4.7, creatinine 1.44 Labs (3/18): K 4.5, creatinine 1.19 Labs (4/19): LDL 71 Labs (6/19): K 4.8, creatinine 1.3  PMH: 1. Hyperlipidemia 2. HTN 3. Prior smoker.  4. Chronic systolic CHF: Nonischemic cardiomyopathy.  - Echo (12/16) with EF 25-30%, restrictive diastolic function, mildly enlarged RV with mildly decreased systolic function, moderate MR, moderate TR, PASP 67 mmHg.  - LHC/RHC (1/17): Mild, nonobstructive CAD; mean RA 7, PA 42/16 mean 26, mean PCWP 17, CI 2.14, PVR 2.52 WU. - Cardiac MRI (8/17): EF 63%, normal RV size and systolic function, no late gadolinium enhancement.  - Echo (  7/19): Mild focal basal septal hypertrophy, EF 60-65%.  5. Throat cancer: S/p surgery at [facility] in 2017.  6. Carotid stenosis: Carotid dopplers 7/17 with 40-59% RICA stenosis.  - Carotid dopplers (7/18): 40-59% BICA - Carotid dopplers (7/19): 40-59% RICA stenosis.    Current Outpatient Medications  Medication Sig Dispense Refill  . aspirin EC 81 MG tablet Take 1 tablet (81 mg total) by mouth every other day. 15 tablet 3  . atorvastatin (LIPITOR) 80 MG tablet Take 0.5 tablets (40 mg total) by mouth daily. (Patient taking  differently: Take 40 mg by mouth at bedtime. ) 15 tablet 3  . carvedilol (COREG) 12.5 MG tablet TAKE 1 TABLET BY MOUTH TWICE DAILY WITH MEALS 180 tablet 0  . sacubitril-valsartan (ENTRESTO) 97-103 MG Take 1 tablet by mouth 2 (two) times daily. 180 tablet 0  . spironolactone (ALDACTONE) 25 MG tablet TAKE 1/2 (ONE-HALF) TABLET BY MOUTH ONCE DAILY 15 tablet 11   No current facility-administered medications for this encounter.     Allergies:   Patient has no known allergies.   Social History:  The patient  reports that he quit smoking about 3 years ago. He has never used smokeless tobacco. He reports that he does not drink alcohol.   Family History:  The patient's family history includes Diabetes Mellitus II in his father; Heart attack in his father; Heart disease in an other family member; Stroke in his brother and father.   ROS:  Please see the history of present illness.   All other systems are personally reviewed and negative.   Exam:  [facility] Call; Exam is subjective.) General:  Speaks in full sentences. No resp difficulty. Lungs: Normal respiratory effort with conversation.  Abdomen: Non-distended per patient report Extremities: Pt denies edema. Neuro: Alert & oriented x 3.   Recent Labs: 05/15/2017: BUN 15; Creatinine, Ser 1.15; Hemoglobin 12.8; Platelets 320; Potassium 4.7; Sodium 141  Personally reviewed   Wt Readings from Last 3 Encounters:  05/30/17 81.2 kg (179 lb)  05/15/17 80.5 kg (177 lb 8 oz)  04/13/16 81 kg (178 lb 8 oz)      ASSESSMENT AND PLAN:  1. Chronic systolic CHF: Nonischemic cardiomyopathy, EF 25-30% on 12/16 echo.  LHC/RHC (1/17) showed mild nonobstructive CAD and near-normal filling pressures on Lasix.  Possible viral myocarditis: episode seemed to start with an infectious event (possible PNA).  Also, must consider hypertensive cardiomyopathy with possibly years of untreated hypertension.  However, he does not have much LVH on his ECG.  He was not a  heavy drinker.  Cardiac MRI in 8/17 showed improvement in EF to 63% with no late gadolinium enhancement and echo in 7/19 showed EF 60-65%.  NYHA class I, not volume overloaded on exam.  - Continue Coreg 12.5 mg bid  - Continue Entresto 97/103 bid.    - He will continue spironolactone 12.5 mg daily.  - BMET today and will need BMET every 3 months on his current meds.    2. HTN: BP has been controlled on current meds.    3. Hyperlipidemia: Continue atorvastatin 40 mg daily. Check lipids with next labs.  Goal LDL < 70 with vascular disease (carotid stenosis).  4. Smoking:  Congratulated on his continued abstinence.  5. Carotid stenosis: Check carotid dopplers in 7/20.  Needs to continue statin and ASA 81 daily.   COVID screen The patient does not have any symptoms that suggest any further testing/ screening at this time.  Social distancing reinforced today.  Patient Risk: After full review of this patients clinical status, I feel that they are at moderate risk for cardiac decompensation at this time.  Relevant cardiac medications were reviewed at length with the patient today. The patient does not have concerns regarding their medications at this time.   Recommended follow-up:  1 year  Today, I have spent 18 minutes with the patient with telehealth technology discussing the above issues .    Signed, [name], MD  05/07/2018 9:43 PM  [place] [address] Heart and [place] [address] [phone] (office) [phone] (fax)

## 2018-05-08 ENCOUNTER — Encounter (HOSPITAL_COMMUNITY): Payer: Self-pay | Admitting: Cardiology

## 2018-06-21 ENCOUNTER — Other Ambulatory Visit (HOSPITAL_COMMUNITY): Payer: Self-pay | Admitting: Internal Medicine

## 2018-06-24 DIAGNOSIS — I361 Nonrheumatic tricuspid (valve) insufficiency: Secondary | ICD-10-CM | POA: Diagnosis not present

## 2018-08-08 ENCOUNTER — Other Ambulatory Visit (HOSPITAL_COMMUNITY): Payer: Self-pay | Admitting: Cardiology

## 2018-08-08 DIAGNOSIS — I6523 Occlusion and stenosis of bilateral carotid arteries: Secondary | ICD-10-CM

## 2018-08-11 ENCOUNTER — Other Ambulatory Visit (HOSPITAL_COMMUNITY): Payer: Self-pay | Admitting: Cardiology

## 2018-08-16 ENCOUNTER — Other Ambulatory Visit: Payer: Self-pay

## 2018-08-16 ENCOUNTER — Other Ambulatory Visit (HOSPITAL_COMMUNITY): Payer: Self-pay | Admitting: Cardiology

## 2018-08-16 ENCOUNTER — Ambulatory Visit (HOSPITAL_COMMUNITY)
Admission: RE | Admit: 2018-08-16 | Discharge: 2018-08-16 | Disposition: A | Discharge status: Home / Self Care | Payer: 59 | Source: Ambulatory Visit | Attending: Internal Medicine | Admitting: Internal Medicine

## 2018-08-16 DIAGNOSIS — I6523 Occlusion and stenosis of bilateral carotid arteries: Secondary | ICD-10-CM

## 2018-08-27 ENCOUNTER — Telehealth (HOSPITAL_COMMUNITY): Payer: Self-pay

## 2018-08-27 DIAGNOSIS — I6523 Occlusion and stenosis of bilateral carotid arteries: Secondary | ICD-10-CM

## 2018-08-27 NOTE — Telephone Encounter (Signed)
-----   Message from Larey Dresser, MD sent at 08/17/2018 11:29 AM EDT ----- 40-59% RICA, repeat study in 1 year.

## 2018-08-27 NOTE — Telephone Encounter (Signed)
LM for patient to return call to office. Order placed for repeat study in 1 year

## 2018-08-30 NOTE — Telephone Encounter (Signed)
-----   Message from Larey Dresser, MD sent at 08/17/2018 11:29 AM EDT ----- 40-59% RICA, repeat study in 1 year.

## 2018-08-30 NOTE — Telephone Encounter (Signed)
Pt wife made aware of results. Will repeat in 1 year. Verbalized understanding.

## 2018-09-02 ENCOUNTER — Other Ambulatory Visit (HOSPITAL_COMMUNITY): Payer: Self-pay | Admitting: Cardiology

## 2018-11-13 ENCOUNTER — Other Ambulatory Visit (HOSPITAL_COMMUNITY): Payer: Self-pay | Admitting: Cardiology

## 2018-11-30 ENCOUNTER — Other Ambulatory Visit (HOSPITAL_COMMUNITY): Payer: Self-pay | Admitting: Cardiology

## 2019-02-11 ENCOUNTER — Other Ambulatory Visit (HOSPITAL_COMMUNITY): Payer: Self-pay | Admitting: Cardiology

## 2019-02-26 ENCOUNTER — Other Ambulatory Visit (HOSPITAL_COMMUNITY): Payer: Self-pay | Admitting: Cardiology

## 2019-03-24 ENCOUNTER — Other Ambulatory Visit (HOSPITAL_COMMUNITY): Payer: Self-pay | Admitting: Cardiology

## 2019-05-12 ENCOUNTER — Other Ambulatory Visit (HOSPITAL_COMMUNITY): Payer: Self-pay | Admitting: Cardiology

## 2019-06-01 ENCOUNTER — Other Ambulatory Visit (HOSPITAL_COMMUNITY): Payer: Self-pay | Admitting: Cardiology

## 2019-06-18 ENCOUNTER — Other Ambulatory Visit (HOSPITAL_COMMUNITY): Payer: Self-pay | Admitting: *Deleted

## 2019-06-18 MED ORDER — SPIRONOLACTONE 25 MG PO TABS: 12.5000 mg | ORAL_TABLET | Freq: Every day | ORAL | 3 refills | Status: DC

## 2019-08-12 ENCOUNTER — Other Ambulatory Visit (HOSPITAL_COMMUNITY): Payer: Self-pay | Admitting: Cardiology

## 2019-08-19 ENCOUNTER — Ambulatory Visit (HOSPITAL_COMMUNITY)
Admission: RE | Admit: 2019-08-19 | Discharge: 2019-08-19 | Disposition: A | Discharge status: Home / Self Care | Payer: 59 | Source: Ambulatory Visit | Attending: Cardiology | Admitting: Cardiology

## 2019-08-19 ENCOUNTER — Other Ambulatory Visit: Payer: Self-pay

## 2019-08-19 DIAGNOSIS — I6523 Occlusion and stenosis of bilateral carotid arteries: Secondary | ICD-10-CM | POA: Diagnosis present

## 2019-09-01 ENCOUNTER — Other Ambulatory Visit (HOSPITAL_COMMUNITY): Payer: Self-pay | Admitting: Cardiology

## 2019-11-02 ENCOUNTER — Other Ambulatory Visit (HOSPITAL_COMMUNITY): Payer: Self-pay | Admitting: Cardiology

## 2019-11-24 ENCOUNTER — Other Ambulatory Visit (HOSPITAL_COMMUNITY): Payer: Self-pay | Admitting: Cardiology

## 2019-12-03 ENCOUNTER — Other Ambulatory Visit (HOSPITAL_COMMUNITY): Payer: Self-pay | Admitting: *Deleted

## 2019-12-03 MED ORDER — SPIRONOLACTONE 25 MG PO TABS: 12.5000 mg | ORAL_TABLET | Freq: Every day | ORAL | 3 refills | Status: DC

## 2019-12-07 ENCOUNTER — Other Ambulatory Visit (HOSPITAL_COMMUNITY): Payer: Self-pay | Admitting: Cardiology

## 2020-01-05 ENCOUNTER — Other Ambulatory Visit (HOSPITAL_COMMUNITY): Payer: Self-pay | Admitting: Cardiology

## 2020-01-07 ENCOUNTER — Encounter (HOSPITAL_COMMUNITY): Payer: Self-pay | Admitting: Cardiology

## 2020-02-03 ENCOUNTER — Other Ambulatory Visit (HOSPITAL_COMMUNITY): Payer: Self-pay | Admitting: Cardiology

## 2020-03-09 ENCOUNTER — Other Ambulatory Visit (HOSPITAL_COMMUNITY): Payer: Self-pay

## 2020-03-09 MED ORDER — ENTRESTO 97-103 MG PO TABS
ORAL_TABLET | ORAL | 0 refills | Status: DC
Start: 2020-03-09 — End: 2020-04-06

## 2020-03-09 MED ORDER — CARVEDILOL 12.5 MG PO TABS: ORAL_TABLET | ORAL | 0 refills | Status: DC

## 2020-03-09 NOTE — Telephone Encounter (Signed)
Meds ordered this encounter  Medications  . carvedilol (COREG) 12.5 MG tablet    Sig: TAKE 1 TABLET BY MOUTH TWICE DAILY WITH MEALS.    Dispense:  60 tablet    Refill:  0  . sacubitril-valsartan (ENTRESTO) 97-103 MG    Sig: TAKE 1 TABLET BY MOUTH TWICE DAILY.    Dispense:  60 tablet    Refill:  0   Patients wife called stating that patient needed a refill on the Rx listed above. Advised patients wife that patient must keep upcoming appointment for further refills

## 2020-03-25 ENCOUNTER — Encounter (HOSPITAL_COMMUNITY): Payer: Self-pay | Admitting: Cardiology

## 2020-03-25 ENCOUNTER — Ambulatory Visit (HOSPITAL_COMMUNITY)
Admission: RE | Admit: 2020-03-25 | Discharge: 2020-03-25 | Disposition: A | Discharge status: Home / Self Care | Payer: BLUE CROSS/BLUE SHIELD | Source: Ambulatory Visit | Attending: Cardiology | Admitting: Cardiology

## 2020-03-25 ENCOUNTER — Other Ambulatory Visit: Payer: Self-pay

## 2020-03-25 VITALS — BP 130/70 | HR 55 | Wt 177.8 lb

## 2020-03-25 DIAGNOSIS — Z8249 Family history of ischemic heart disease and other diseases of the circulatory system: Secondary | ICD-10-CM | POA: Diagnosis not present

## 2020-03-25 DIAGNOSIS — I110 Hypertensive heart disease with heart failure: Secondary | ICD-10-CM | POA: Insufficient documentation

## 2020-03-25 DIAGNOSIS — I6523 Occlusion and stenosis of bilateral carotid arteries: Secondary | ICD-10-CM

## 2020-03-25 DIAGNOSIS — E785 Hyperlipidemia, unspecified: Secondary | ICD-10-CM | POA: Insufficient documentation

## 2020-03-25 DIAGNOSIS — I5022 Chronic systolic (congestive) heart failure: Secondary | ICD-10-CM | POA: Diagnosis not present

## 2020-03-25 DIAGNOSIS — I428 Other cardiomyopathies: Secondary | ICD-10-CM | POA: Insufficient documentation

## 2020-03-25 DIAGNOSIS — I2510 Atherosclerotic heart disease of native coronary artery without angina pectoris: Secondary | ICD-10-CM | POA: Insufficient documentation

## 2020-03-25 DIAGNOSIS — Z79899 Other long term (current) drug therapy: Secondary | ICD-10-CM | POA: Diagnosis not present

## 2020-03-25 DIAGNOSIS — I6529 Occlusion and stenosis of unspecified carotid artery: Secondary | ICD-10-CM | POA: Insufficient documentation

## 2020-03-25 DIAGNOSIS — Z7982 Long term (current) use of aspirin: Secondary | ICD-10-CM | POA: Insufficient documentation

## 2020-03-25 DIAGNOSIS — Z87891 Personal history of nicotine dependence: Secondary | ICD-10-CM | POA: Insufficient documentation

## 2020-03-25 HISTORY — DX: Heart failure, unspecified: I50.9

## 2020-03-25 LAB — BASIC METABOLIC PANEL
Anion gap: 7 (ref 5–15)
BUN: 13 mg/dL (ref 8–23)
CO2: 24 mmol/L (ref 22–32)
Calcium: 9.5 mg/dL (ref 8.9–10.3)
Chloride: 106 mmol/L (ref 98–111)
Creatinine, Ser: 1.21 mg/dL (ref 0.61–1.24)
GFR, Estimated: >60 mL/min (ref >60)
Glucose, Bld: 116 mg/dL — ABNORMAL HIGH (ref 70–99)
Potassium: 4.8 mmol/L (ref 3.5–5.1)
Sodium: 137 mmol/L (ref 135–145)

## 2020-03-25 LAB — CBC
HCT: 41.7 % (ref 39.0–52.0)
Hemoglobin: 14 g/dL (ref 13.0–17.0)
MCH: 30.1 pg (ref 26.0–34.0)
MCHC: 33.6 g/dL (ref 30.0–36.0)
MCV: 89.7 fL (ref 80.0–100.0)
Platelets: 328 10*3/uL (ref 150–400)
RBC: 4.65 MIL/uL (ref 4.22–5.81)
RDW: 12.8 % (ref 11.5–15.5)
WBC: 7 10*3/uL (ref 4.0–10.5)
nRBC: 0 % (ref 0.0–0.2)

## 2020-03-25 LAB — LIPID PANEL
Cholesterol: 121 mg/dL (ref 0–200)
HDL: 37 mg/dL — ABNORMAL LOW (ref >40)
LDL Cholesterol: 59 mg/dL (ref 0–99)
Total CHOL/HDL Ratio: 3.3 RATIO
Triglycerides: 127 mg/dL (ref <150)
VLDL: 25 mg/dL (ref 0–40)

## 2020-03-25 NOTE — Patient Instructions (Addendum)
Labs done today. We will contact you only if your labs are abnormal.  No medication changes were made. Please continue all current medications as prescribed.  Your physician recommends that you schedule a follow-up appointment in: 3 months for a lab only appointment at your pcp office(paper prescription given to you today in office) and in 1 year for an appointment with Dr. Aundra Dubin  Your physician has requested that you have a carotid duplex. This test is an ultrasound of the carotid arteries in your neck. It looks at blood flow through these arteries that supply the brain with blood. Allow one hour for this exam. There are no restrictions or special instructions. This has to be approved through insurance prior to scheduling, once approved we will contact you to schedule an appointment.     If you have any questions or concerns before your next appointment please send [place] a message through [place] or call our office at [phone].    TO LEAVE A MESSAGE FOR THE NURSE SELECT OPTION 2, PLEASE LEAVE A MESSAGE INCLUDING: . YOUR NAME . DATE OF BIRTH . CALL BACK NUMBER . REASON FOR CALL**this is important as we prioritize the call backs  YOU WILL RECEIVE A CALL BACK THE SAME DAY AS LONG AS YOU CALL BEFORE 4:00 PM   Do the following things EVERYDAY: 1) Weigh yourself in the morning before breakfast. Write it down and keep it in a log. 2) Take your medicines as prescribed 3) Eat low salt foods--Limit salt (sodium) to 2000 mg per day.  4) Stay as active as you can everyday 5) Limit all fluids for the day to less than 2 liters   At the [facility], you and your health needs are our priority. As part of our continuing mission to provide you with exceptional heart care, we have created designated Provider Care Teams. These Care Teams include your primary Cardiologist (physician) and Advanced Practice Providers (APPs- Physician Assistants and Nurse Practitioners) who all work together  to provide you with the care you need, when you need it.   You may see any of the following providers on your designated Care Team at your next follow up: Marland Kitchen Dr Glori Bickers . Dr [name] . Darrick Grinder, NP . Lyda Jester, PA . Audry Riles, PharmD   Please be sure to bring in all your medications bottles to every appointment.

## 2020-03-25 NOTE — Progress Notes (Signed)
Date:  03/25/2020   ID:  Carlos [name], DOB [birth year], MRN [id number]   Provider location: [place] [facility] Type of Visit: Established patient   PCP:  Street, Sharon Mt, MD  Cardiologist:  Dr. Aundra Dubin   History of Present Illness: Carlos [name] is a 65 y.o. male who had minimal medical care up until 12/16.  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  Around Thanksgiving of 2016, he developed a cough and exertional shortness of breath.  He went to an urgent care and had a CXR that was suggestive of PNA, also showed cardiomegaly.  He did not improve with antibiotics.  Dyspnea became progressively worse and he developed ankle edema.  At this point, he went to see Dr Venetia Maxon in [place].  He was found to be hypertensive and was started on Coreg and losartan.  Atorvastatin was started for elevated lipids.  He was sent for an echo at the hospital in [place], [place] was 25-30%. He was therefore referred here.  Right and left heart cath showed minimal CAD, reasonably optimized filling pressures on Lasix.  Cardiac index was low but not markedly so.   No smoking since April 16, 2015 with presume throat cancer. Had biopsy of throat mass 04/17/15 at [facility] showing invasive poorly differentiated squamous cell carcinoma, with 1 lymph node positive. Had surgical removal in 04/2015. No chemo or radiation required.   Cardiac MRI was done in 8/17.  EF up to 63% with normal RV.  No late gadolinium enhancement.  Echo was done in 7/19, showing that EF remained normal at 60-65%.   He continues to take all his cardiac meds and seems to be doing well.  He is working full time. No exertional dyspnea or chest pain.  No palpitations or lightheadedness.  BP is controlled.   ECG (personally reviewed): NSR, with TWI in AVL.    Labs (12/16): K 4.5, creatinine 1.21, LFTs normal Labs (1/17): K 4.8,  creatinine 1.13 Labs (2/17): K 5.1 => 4.4, creatinine 1.69 => 1.1 Labs (4/17): K 4.2, Creatinine 1.12 Labs (7/17): K 4.3, creatinine 1.28 Labs (8/17): LDL 58, HDL 34 Labs (9/17): K 4.7, creatinine 1.44 Labs (3/18): K 4.5, creatinine 1.19 Labs (4/19): LDL 71 Labs (6/19): K 4.8, creatinine 1.3  PMH: 1. Hyperlipidemia 2. HTN 3. Prior smoker.  4. Chronic systolic CHF: Nonischemic cardiomyopathy.  - Echo (12/16) with EF 25-30%, restrictive diastolic function, mildly enlarged RV with mildly decreased systolic function, moderate MR, moderate TR, PASP 67 mmHg.  - LHC/RHC (1/17): Mild, nonobstructive CAD; mean RA 7, PA 42/16 mean 26, mean PCWP 17, CI 2.14, PVR 2.52 WU. - Cardiac MRI (8/17): EF 63%, normal RV size and systolic function, no late gadolinium enhancement.  - Echo (7/19): Mild focal basal septal hypertrophy, EF 60-65%.  5. Throat cancer: S/p surgery at [facility] in 2017.  6. Carotid stenosis: Carotid dopplers 7/17 with 40-59% RICA stenosis.  - Carotid dopplers (7/18): 40-59% BICA - Carotid dopplers (7/19): 40-59% RICA stenosis.  - Carotid dopplers (8/21): 1-39% BICA stenosis.    Current Outpatient Medications  Medication Sig Dispense Refill   aspirin EC 81 MG tablet Take 81 mg by mouth every 3 (three) days. Swallow whole.     atorvastatin (LIPITOR) 80 MG tablet Take 0.5 tablets (40 mg total) by mouth daily. 15 tablet  3   carvedilol (COREG) 12.5 MG tablet TAKE 1 TABLET BY MOUTH TWICE DAILY WITH MEALS. 60 tablet 0   sacubitril-valsartan (ENTRESTO) 97-103 MG TAKE 1 TABLET BY MOUTH TWICE DAILY. 60 tablet 0   spironolactone (ALDACTONE) 25 MG tablet Take 25 mg by mouth daily.     No current facility-administered medications for this encounter.    Allergies:   Patient has no known allergies.   Social History:  The patient  reports that he quit smoking about 5 years ago. He has never used smokeless tobacco. He reports that he does not drink alcohol.   Family History:  The  patient's family history includes Diabetes Mellitus II in his father; Heart attack in his father; Heart disease in an other family member; Stroke in his brother and father.   ROS:  Please see the history of present illness.   All other systems are personally reviewed and negative.   Exam:  BP 130/70    Pulse (!) 55    Wt 80.6 kg (177 lb 12.8 oz)    SpO2 96%    BMI 30.52 kg/m  General: NAD Neck: No JVD, no thyromegaly or thyroid nodule.  Lungs: Clear to auscultation bilaterally with normal respiratory effort. CV: Nondisplaced PMI.  Heart regular S1/S2, no S3/S4, no murmur.  No peripheral edema.  No carotid bruit.  Normal pedal pulses.  Abdomen: Soft, nontender, no hepatosplenomegaly, no distention.  Skin: Intact without lesions or rashes.  Neurologic: Alert and oriented x 3.  Psych: Normal affect. Extremities: No clubbing or cyanosis.  HEENT: Normal.    Recent Labs: 03/25/2020: BUN 13; Creatinine, Ser 1.21; Hemoglobin 14.0; Platelets 328; Potassium 4.8; Sodium 137  Personally reviewed   Wt Readings from Last 3 Encounters:  03/25/20 80.6 kg (177 lb 12.8 oz)  05/30/17 81.2 kg (179 lb)  05/15/17 80.5 kg (177 lb 8 oz)      ASSESSMENT AND PLAN:  1. Chronic systolic CHF: Nonischemic cardiomyopathy, EF 25-30% on 12/16 echo.  LHC/RHC (1/17) showed mild nonobstructive CAD and near-normal filling pressures on Lasix.  Possible viral myocarditis: episode seemed to start with an infectious event (possible PNA).  Also, must consider hypertensive cardiomyopathy with possibly years of untreated hypertension.  However, he does not have much LVH on his ECG.  He was not a heavy drinker.  Cardiac MRI in 8/17 showed improvement in EF to 63% with no late gadolinium enhancement and echo in 7/19 showed EF 60-65%.  NYHA class I, not volume overloaded on exam.  - Continue Coreg 12.5 mg bid  - Continue Entresto 97/103 bid.    - He will continue spironolactone 25 mg daily.  - BMET today and will need BMET every  3 months on his current meds.    2. HTN: BP has been controlled on current meds.    3. Hyperlipidemia: Continue atorvastatin 40 mg daily. Check lipids today.  Goal LDL < 70 with vascular disease (carotid stenosis).  4. Smoking:  Congratulated on his continued abstinence.  5. Carotid stenosis: Repeat carotid dopplers in 8/22.  Needs to continue statin and ASA 81 daily.   Signed, [name], MD  03/25/2020   [place] [address] Heart and [place] [address] [phone] (office) [phone] (fax)

## 2020-03-29 ENCOUNTER — Other Ambulatory Visit (HOSPITAL_COMMUNITY): Payer: Self-pay | Admitting: Cardiology

## 2020-04-04 ENCOUNTER — Other Ambulatory Visit (HOSPITAL_COMMUNITY): Payer: Self-pay | Admitting: Cardiology

## 2020-07-15 ENCOUNTER — Other Ambulatory Visit (HOSPITAL_COMMUNITY): Payer: Self-pay | Admitting: Cardiology

## 2020-11-13 ENCOUNTER — Other Ambulatory Visit: Payer: Self-pay

## 2020-11-13 ENCOUNTER — Ambulatory Visit (HOSPITAL_COMMUNITY)
Admission: RE | Admit: 2020-11-13 | Discharge: 2020-11-13 | Disposition: A | Discharge status: Home / Self Care | Payer: BLUE CROSS/BLUE SHIELD | Source: Ambulatory Visit | Attending: Cardiovascular Disease | Admitting: Cardiovascular Disease

## 2020-11-13 DIAGNOSIS — I6529 Occlusion and stenosis of unspecified carotid artery: Secondary | ICD-10-CM | POA: Diagnosis present

## 2020-11-13 DIAGNOSIS — I6523 Occlusion and stenosis of bilateral carotid arteries: Secondary | ICD-10-CM

## 2021-04-19 ENCOUNTER — Other Ambulatory Visit (HOSPITAL_COMMUNITY): Payer: Self-pay | Admitting: Cardiology

## 2021-04-24 ENCOUNTER — Other Ambulatory Visit (HOSPITAL_COMMUNITY): Payer: Self-pay | Admitting: Cardiology

## 2021-07-09 ENCOUNTER — Other Ambulatory Visit (HOSPITAL_COMMUNITY): Payer: Self-pay | Admitting: Cardiology

## 2021-08-10 ENCOUNTER — Other Ambulatory Visit (HOSPITAL_COMMUNITY): Payer: Self-pay | Admitting: Cardiology

## 2021-09-10 ENCOUNTER — Other Ambulatory Visit (HOSPITAL_COMMUNITY): Payer: Self-pay | Admitting: Cardiology

## 2021-09-26 ENCOUNTER — Other Ambulatory Visit (HOSPITAL_COMMUNITY): Payer: Self-pay | Admitting: Cardiology

## 2021-10-04 ENCOUNTER — Telehealth (HOSPITAL_COMMUNITY): Payer: Self-pay | Admitting: Vascular Surgery

## 2021-10-04 MED ORDER — ENTRESTO 97-103 MG PO TABS: 1.0000 | ORAL_TABLET | Freq: Two times a day (BID) | ORAL | 1 refills | Status: DC

## 2021-10-04 NOTE — Telephone Encounter (Signed)
Pt need refill of Entresto, pt wife states they only gave pt 10 pills yesterday his next appt is 10/27.Marland Kitchen please advise

## 2021-10-04 NOTE — Telephone Encounter (Signed)
Rx sent in, patient advised medication was sent in

## 2021-10-14 ENCOUNTER — Other Ambulatory Visit (HOSPITAL_COMMUNITY): Payer: Self-pay | Admitting: Cardiology

## 2021-11-12 ENCOUNTER — Encounter (HOSPITAL_COMMUNITY): Payer: BLUE CROSS/BLUE SHIELD | Admitting: Cardiology

## 2021-11-27 ENCOUNTER — Other Ambulatory Visit (HOSPITAL_COMMUNITY): Payer: Self-pay | Admitting: Cardiology

## 2021-12-20 ENCOUNTER — Other Ambulatory Visit (HOSPITAL_COMMUNITY): Payer: Self-pay | Admitting: Cardiology

## 2021-12-30 ENCOUNTER — Other Ambulatory Visit (HOSPITAL_COMMUNITY): Payer: Self-pay | Admitting: Cardiology

## 2022-01-08 ENCOUNTER — Other Ambulatory Visit (HOSPITAL_COMMUNITY): Payer: Self-pay | Admitting: Cardiology

## 2022-01-22 ENCOUNTER — Other Ambulatory Visit (HOSPITAL_COMMUNITY): Payer: Self-pay | Admitting: Cardiology

## 2022-01-26 ENCOUNTER — Other Ambulatory Visit (HOSPITAL_COMMUNITY): Payer: Self-pay

## 2022-01-26 MED ORDER — ENTRESTO 97-103 MG PO TABS: ORAL_TABLET | ORAL | 0 refills | Status: DC

## 2022-01-30 ENCOUNTER — Other Ambulatory Visit (HOSPITAL_COMMUNITY): Payer: Self-pay | Admitting: Cardiology

## 2022-02-03 ENCOUNTER — Encounter (HOSPITAL_COMMUNITY): Payer: BLUE CROSS/BLUE SHIELD | Admitting: Cardiology

## 2022-02-04 ENCOUNTER — Other Ambulatory Visit (HOSPITAL_COMMUNITY): Payer: Self-pay

## 2022-02-04 ENCOUNTER — Encounter (HOSPITAL_COMMUNITY): Payer: Self-pay | Admitting: Cardiology

## 2022-02-04 ENCOUNTER — Ambulatory Visit (HOSPITAL_COMMUNITY)
Admission: RE | Admit: 2022-02-04 | Discharge: 2022-02-04 | Disposition: A | Discharge status: Home / Self Care | Payer: BLUE CROSS/BLUE SHIELD | Source: Ambulatory Visit | Attending: Cardiology | Admitting: Cardiology

## 2022-02-04 VITALS — BP 152/76 | HR 67 | Wt 179.8 lb

## 2022-02-04 DIAGNOSIS — I428 Other cardiomyopathies: Secondary | ICD-10-CM | POA: Insufficient documentation

## 2022-02-04 DIAGNOSIS — I498 Other specified cardiac arrhythmias: Secondary | ICD-10-CM | POA: Insufficient documentation

## 2022-02-04 DIAGNOSIS — I6529 Occlusion and stenosis of unspecified carotid artery: Secondary | ICD-10-CM | POA: Insufficient documentation

## 2022-02-04 DIAGNOSIS — Z9049 Acquired absence of other specified parts of digestive tract: Secondary | ICD-10-CM | POA: Diagnosis not present

## 2022-02-04 DIAGNOSIS — Z7982 Long term (current) use of aspirin: Secondary | ICD-10-CM | POA: Insufficient documentation

## 2022-02-04 DIAGNOSIS — I110 Hypertensive heart disease with heart failure: Secondary | ICD-10-CM | POA: Insufficient documentation

## 2022-02-04 DIAGNOSIS — Z8521 Personal history of malignant neoplasm of larynx: Secondary | ICD-10-CM | POA: Diagnosis not present

## 2022-02-04 DIAGNOSIS — Z87891 Personal history of nicotine dependence: Secondary | ICD-10-CM | POA: Insufficient documentation

## 2022-02-04 DIAGNOSIS — I2510 Atherosclerotic heart disease of native coronary artery without angina pectoris: Secondary | ICD-10-CM | POA: Insufficient documentation

## 2022-02-04 DIAGNOSIS — Z79899 Other long term (current) drug therapy: Secondary | ICD-10-CM | POA: Insufficient documentation

## 2022-02-04 DIAGNOSIS — E785 Hyperlipidemia, unspecified: Secondary | ICD-10-CM | POA: Insufficient documentation

## 2022-02-04 DIAGNOSIS — I5022 Chronic systolic (congestive) heart failure: Secondary | ICD-10-CM

## 2022-02-04 LAB — BASIC METABOLIC PANEL
Anion gap: 7 (ref 5–15)
BUN: 17 mg/dL (ref 8–23)
CO2: 27 mmol/L (ref 22–32)
Calcium: 9.4 mg/dL (ref 8.9–10.3)
Chloride: 104 mmol/L (ref 98–111)
Creatinine, Ser: 1.29 mg/dL — ABNORMAL HIGH (ref 0.61–1.24)
GFR, Estimated: >60 mL/min (ref >60)
Glucose, Bld: 101 mg/dL — ABNORMAL HIGH (ref 70–99)
Potassium: 4.5 mmol/L (ref 3.5–5.1)
Sodium: 138 mmol/L (ref 135–145)

## 2022-02-04 LAB — LIPID PANEL
Cholesterol: 116 mg/dL (ref 0–200)
HDL: 35 mg/dL — ABNORMAL LOW (ref >40)
LDL Cholesterol: 45 mg/dL (ref 0–99)
Total CHOL/HDL Ratio: 3.3 RATIO
Triglycerides: 179 mg/dL — ABNORMAL HIGH (ref <150)
VLDL: 36 mg/dL (ref 0–40)

## 2022-02-04 MED ORDER — ENTRESTO 97-103 MG PO TABS: ORAL_TABLET | ORAL | 11 refills | Status: DC

## 2022-02-04 NOTE — Patient Instructions (Signed)
There has been no changes to your medications,  Labs done today, your results will be available in MyChart, we will contact you for abnormal readings.  Your physician has requested that you have an echocardiogram. Echocardiography is a painless test that uses sound waves to create images of your heart. It provides your doctor with information about the size and shape of your heart and how well your heart's chambers and valves are working. This procedure takes approximately one hour. There are no restrictions for this procedure. Please do NOT wear cologne, perfume, aftershave, or lotions (deodorant is allowed). Please arrive 15 minutes prior to your appointment time.  Your provider has ordered Carotid dopplers for you . You will be called to have the test arranged.  Your physician recommends that you schedule a follow-up appointment in: 1 year (January 2025)  ** please call the office in November 2024 to arrange your follow up appointment**  If you have any questions or concerns before your next appointment please send [place] a message through [place] or call our office at [phone].    TO LEAVE A MESSAGE FOR THE NURSE SELECT OPTION 2, PLEASE LEAVE A MESSAGE INCLUDING: YOUR NAME DATE OF BIRTH CALL BACK NUMBER REASON FOR CALL**this is important as we prioritize the call backs  YOU WILL RECEIVE A CALL BACK THE SAME DAY AS LONG AS YOU CALL BEFORE 4:00 PM  At the [facility], you and your health needs are our priority. As part of our continuing mission to provide you with exceptional heart care, we have created designated Provider Care Teams. These Care Teams include your primary Cardiologist (physician) and Advanced Practice Providers (APPs- Physician Assistants and Nurse Practitioners) who all work together to provide you with the care you need, when you need it.   You may see any of the following providers on your designated Care Team at your next follow up: Dr Glori Bickers Dr [name] Dr. Roxana Hires, NP Lyda Jester, [place] [facility], [place] Forestine Na, NP Audry Riles, PharmD   Please be sure to bring in all your medications bottles to every appointment.

## 2022-02-04 NOTE — Progress Notes (Signed)
Date:  02/04/2022   ID:  Forrest Moron, DOB [birth year], MRN [id number]   Provider location: [facility] Type of Visit: Established patient   PCP:  Street, Sharon Mt, MD  Cardiologist:  Dr. Aundra Dubin   History of Present Illness: Kaven Cumbie is a 67 y.o. male who had minimal medical care up until 12/16.  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  Around Thanksgiving of 2016, he developed a cough and exertional shortness of breath.  He went to an urgent care and had a CXR that was suggestive of PNA, also showed cardiomegaly.  He did not improve with antibiotics.  Dyspnea became progressively worse and he developed ankle edema.  At this point, he went to see Dr Venetia Maxon in [place].  He was found to be hypertensive and was started on Coreg and losartan.  Atorvastatin was started for elevated lipids.  He was sent for an echo at the hospital in [place], [place] was 25-30%. He was therefore referred here.  Right and left heart cath showed minimal CAD, reasonably optimized filling pressures on Lasix.  Cardiac index was low but not markedly so.    No smoking since April 16, 2015 with presume throat cancer. Had biopsy of throat mass 04/17/15 at [facility] showing invasive poorly differentiated squamous cell carcinoma, with 1 lymph node positive. Had surgical removal in 04/2015. No chemo or radiation required.    Cardiac MRI was done in 8/17.  EF up to 63% with normal RV.  No late gadolinium enhancement.  Echo was done in 7/19, showing that EF remained normal at 60-65%.   Patient had acute appendicitis with gangrene in 10/23, had appendectomy.    Patient returns for followup of recovered cardiomyopathy.  Weight is up 2 lbs compared to prior appointment.  He works full time at [employer].  His BP is high today but he has been out of Entresto.  No exertional dyspnea.  No chest  pain.  No orthopnea/PND.  No lightheadedness.  Overall, feeling good.   ECG (personally reviewed): NSR, normal     Labs (12/16): K 4.5, creatinine 1.21, LFTs normal Labs (1/17): K 4.8, creatinine 1.13 Labs (2/17): K 5.1 => 4.4, creatinine 1.69 => 1.1 Labs (4/17): K 4.2, Creatinine 1.12 Labs (7/17): K 4.3, creatinine 1.28 Labs (8/17): LDL 58, HDL 34 Labs (9/17): K 4.7, creatinine 1.44 Labs (3/18): K 4.5, creatinine 1.19 Labs (4/19): LDL 71 Labs (6/19): K 4.8, creatinine 1.3 Labs (3/22): LDL 57 Labs (11/23): K 4.1, creatinine 1.04   PMH: 1. Hyperlipidemia 2. HTN 3. Prior smoker.  4. Chronic systolic CHF: Nonischemic cardiomyopathy.  - Echo (12/16) with EF 25-30%, restrictive diastolic function, mildly enlarged RV with mildly decreased systolic function, moderate MR, moderate TR, PASP 67 mmHg.  - LHC/RHC (1/17): Mild, nonobstructive CAD; mean RA 7, PA 42/16 mean 26, mean PCWP 17, CI 2.14, PVR 2.52 WU. - Cardiac MRI (8/17): EF 63%, normal RV size and systolic function, no late gadolinium enhancement.  - Echo (7/19): Mild focal basal septal hypertrophy, EF 60-65%.  5. Throat cancer: S/p surgery at [facility] in 2017.  6. Carotid stenosis: Carotid dopplers 7/17 with 40-59% RICA stenosis.  - Carotid dopplers (7/18): 40-59% BICA - Carotid dopplers (7/19): 40-59% RICA stenosis.  - Carotid dopplers (8/21): 1-39% BICA stenosis.  - Carotid dopplers (  10/22): Minimal stenosis.     Current Outpatient Medications  Medication Sig Dispense Refill   aspirin EC 81 MG tablet Take 81 mg by mouth every 3 (three) days. Swallow whole.     atorvastatin (LIPITOR) 80 MG tablet Take 0.5 tablets (40 mg total) by mouth daily. 15 tablet 3   carvedilol (COREG) 12.5 MG tablet TAKE 1 TABLET BY MOUTH TWICE DAILY WITH MEALS 60 tablet 11   Multiple Vitamin (MULTIVITAMIN WITH MINERALS) TABS tablet Take 1 tablet by mouth daily.     spironolactone (ALDACTONE) 25 MG tablet TAKE 1/2 (ONE-HALF) TABLET BY MOUTH ONCE  DAILY . APPOINTMENT REQUIRED FOR FUTURE REFILLS 15 tablet 0   sacubitril-valsartan (ENTRESTO) 97-103 MG TAKE 1 TABLET BY MOUTH TWICE DAILY . 30 tablet 11   No current facility-administered medications for this encounter.    Allergies:   Patient has no known allergies.   Social History:  The patient  reports that he quit smoking about 6 years ago. His smoking use included cigarettes. He has never used smokeless tobacco. He reports that he does not drink alcohol.   Family History:  The patient's family history includes Diabetes Mellitus II in his father; Heart attack in his father; Heart disease in an other family member; Stroke in his brother and father.   ROS:  Please see the history of present illness.   All other systems are personally reviewed and negative.   Exam:  BP (!) 152/76   Pulse 67   Wt 81.6 kg (179 lb 12.8 oz)   SpO2 95%   BMI 30.86 kg/m  General: NAD Neck: No JVD, no thyromegaly or thyroid nodule.  Lungs: Clear to auscultation bilaterally with normal respiratory effort. CV: Nondisplaced PMI.  Heart regular S1/S2, no S3/S4, no murmur.  No peripheral edema.  Right carotid bruit.  Normal pedal pulses.  Abdomen: Soft, nontender, no hepatosplenomegaly, no distention.  Skin: Intact without lesions or rashes.  Neurologic: Alert and oriented x 3.  Psych: Normal affect. Extremities: No clubbing or cyanosis.  HEENT: Normal.   Recent Labs: 02/04/2022: BUN 17; Creatinine, Ser 1.29; Potassium 4.5; Sodium 138  Personally reviewed   Wt Readings from Last 3 Encounters:  02/04/22 81.6 kg (179 lb 12.8 oz)  03/25/20 80.6 kg (177 lb 12.8 oz)  05/30/17 81.2 kg (179 lb)      ASSESSMENT AND PLAN:  1. Chronic systolic CHF: Nonischemic cardiomyopathy, EF 25-30% on 12/16 echo.  LHC/RHC (1/17) showed mild nonobstructive CAD and near-normal filling pressures on Lasix.  Possible viral myocarditis: episode seemed to start with an infectious event (possible PNA).  Also, must consider  hypertensive cardiomyopathy with possibly years of untreated hypertension.  However, he does not have much LVH on his ECG.  He was not a heavy drinker.  Cardiac MRI in 8/17 showed improvement in EF to 63% with no late gadolinium enhancement and echo in 7/19 showed EF 60-65%.  NYHA class I, not volume overloaded on exam.  - Continue Coreg 12.5 mg bid  - Restart Entresto 97/103 bid.    - He will continue spironolactone 12.5 mg daily.  - BMET today and will need BMET every 3 months on his current meds.   - I will arrange for repeat echo to make sure EF remains normal.   2. HTN: BP high today, needs to start back on Entresto as above.    3. Hyperlipidemia: Continue atorvastatin 40 mg daily.  Check lipids today.  Goal LDL < 70 with vascular disease (carotid stenosis).  4. Smoking:  Congratulated on his continued abstinence.  5. Carotid stenosis:  - I will arrange for carotid dopplers.  - Needs to continue statin and ASA 81 daily.   Signed, [name], MD  02/04/2022   [facility] [address] [facility] [place]  [address] [phone] (office) [phone] (fax)

## 2022-02-25 ENCOUNTER — Ambulatory Visit (HOSPITAL_COMMUNITY)
Admission: RE | Admit: 2022-02-25 | Discharge: 2022-02-25 | Disposition: A | Discharge status: Home / Self Care | Payer: BLUE CROSS/BLUE SHIELD | Source: Ambulatory Visit | Attending: Cardiology | Admitting: Cardiology

## 2022-02-25 ENCOUNTER — Ambulatory Visit (HOSPITAL_BASED_OUTPATIENT_CLINIC_OR_DEPARTMENT_OTHER)
Admission: RE | Admit: 2022-02-25 | Discharge: 2022-02-25 | Disposition: A | Discharge status: Home / Self Care | Payer: BLUE CROSS/BLUE SHIELD | Source: Ambulatory Visit | Attending: Cardiology | Admitting: Cardiology

## 2022-02-25 DIAGNOSIS — I5022 Chronic systolic (congestive) heart failure: Secondary | ICD-10-CM | POA: Insufficient documentation

## 2022-02-25 DIAGNOSIS — Z87891 Personal history of nicotine dependence: Secondary | ICD-10-CM | POA: Diagnosis not present

## 2022-02-25 DIAGNOSIS — I110 Hypertensive heart disease with heart failure: Secondary | ICD-10-CM | POA: Diagnosis present

## 2022-02-25 LAB — ECHOCARDIOGRAM COMPLETE
AR max vel: 2.91 cm2
AV Area VTI: 2.98 cm2
AV Area mean vel: 2.72 cm2
AV Mean grad: 2 mmHg
AV Peak grad: 3.4 mmHg
Ao pk vel: 0.92 m/s
Area-P 1/2: 3.89 cm2
Calc EF: 67.5 %
S' Lateral: 2.9 cm
Single Plane A2C EF: 63.3 %
Single Plane A4C EF: 69.8 %

## 2022-02-25 NOTE — Progress Notes (Signed)
Carotid artery duplex has been completed. Preliminary results can be found in CV Proc through chart review.   02/25/22 9:10 AM Carlos [name] RVT

## 2022-02-28 ENCOUNTER — Telehealth (HOSPITAL_COMMUNITY): Payer: Self-pay | Admitting: *Deleted

## 2022-02-28 NOTE — Telephone Encounter (Addendum)
Called wife per Dr. Aundra Dubin with following results of Carotid Doppler and echocardiogram:  "Minimal carotid stenosis".  "EF remains normal"  Wife verbalized understanding of same and will share info with patient.

## 2022-03-02 ENCOUNTER — Other Ambulatory Visit (HOSPITAL_COMMUNITY): Payer: Self-pay | Admitting: Cardiology

## 2022-04-07 ENCOUNTER — Other Ambulatory Visit (HOSPITAL_COMMUNITY): Payer: Self-pay | Admitting: Cardiology

## 2022-04-11 ENCOUNTER — Other Ambulatory Visit (HOSPITAL_COMMUNITY): Payer: Self-pay | Admitting: Cardiology

## 2022-04-11 MED ORDER — SPIRONOLACTONE 25 MG PO TABS: 12.5000 mg | ORAL_TABLET | Freq: Every day | ORAL | 3 refills | Status: DC

## 2022-05-02 ENCOUNTER — Other Ambulatory Visit (HOSPITAL_COMMUNITY): Payer: Self-pay | Admitting: Cardiology

## 2022-08-08 ENCOUNTER — Other Ambulatory Visit (HOSPITAL_COMMUNITY): Payer: Self-pay | Admitting: Cardiology

## 2022-09-20 ENCOUNTER — Other Ambulatory Visit (HOSPITAL_COMMUNITY): Payer: Self-pay | Admitting: Cardiology

## 2023-03-18 ENCOUNTER — Other Ambulatory Visit (HOSPITAL_COMMUNITY): Payer: Self-pay | Admitting: Cardiology

## 2023-03-30 ENCOUNTER — Telehealth (HOSPITAL_COMMUNITY): Payer: Self-pay | Admitting: Cardiology

## 2023-05-09 ENCOUNTER — Ambulatory Visit (HOSPITAL_COMMUNITY)
Admission: RE | Admit: 2023-05-09 | Discharge: 2023-05-09 | Disposition: A | Discharge status: Home / Self Care | Source: Ambulatory Visit | Attending: Cardiology | Admitting: Cardiology

## 2023-05-09 ENCOUNTER — Encounter (HOSPITAL_COMMUNITY): Payer: Self-pay | Admitting: Cardiology

## 2023-05-09 VITALS — BP 130/80 | HR 52 | Wt 183.0 lb

## 2023-05-09 DIAGNOSIS — I6523 Occlusion and stenosis of bilateral carotid arteries: Secondary | ICD-10-CM | POA: Insufficient documentation

## 2023-05-09 DIAGNOSIS — I5022 Chronic systolic (congestive) heart failure: Secondary | ICD-10-CM | POA: Insufficient documentation

## 2023-05-09 DIAGNOSIS — Z87891 Personal history of nicotine dependence: Secondary | ICD-10-CM | POA: Insufficient documentation

## 2023-05-09 DIAGNOSIS — I428 Other cardiomyopathies: Secondary | ICD-10-CM | POA: Insufficient documentation

## 2023-05-09 DIAGNOSIS — Z7982 Long term (current) use of aspirin: Secondary | ICD-10-CM | POA: Diagnosis not present

## 2023-05-09 DIAGNOSIS — Z79899 Other long term (current) drug therapy: Secondary | ICD-10-CM | POA: Diagnosis not present

## 2023-05-09 DIAGNOSIS — I110 Hypertensive heart disease with heart failure: Secondary | ICD-10-CM | POA: Diagnosis not present

## 2023-05-09 DIAGNOSIS — E785 Hyperlipidemia, unspecified: Secondary | ICD-10-CM | POA: Diagnosis not present

## 2023-05-09 DIAGNOSIS — R9431 Abnormal electrocardiogram [ECG] [EKG]: Secondary | ICD-10-CM | POA: Insufficient documentation

## 2023-05-09 LAB — BASIC METABOLIC PANEL WITH GFR
Anion gap: 10 (ref 5–15)
BUN: 12 mg/dL (ref 8–23)
CO2: 25 mmol/L (ref 22–32)
Calcium: 9.2 mg/dL (ref 8.9–10.3)
Chloride: 102 mmol/L (ref 98–111)
Creatinine, Ser: 1.26 mg/dL — ABNORMAL HIGH (ref 0.61–1.24)
GFR, Estimated: >60 mL/min (ref >60)
Glucose, Bld: 109 mg/dL — ABNORMAL HIGH (ref 70–99)
Potassium: 4 mmol/L (ref 3.5–5.1)
Sodium: 137 mmol/L (ref 135–145)

## 2023-05-09 LAB — LIPID PANEL
Cholesterol: 136 mg/dL (ref 0–200)
HDL: 34 mg/dL — ABNORMAL LOW (ref >40)
LDL Cholesterol: 56 mg/dL (ref 0–99)
Total CHOL/HDL Ratio: 4 ratio
Triglycerides: 228 mg/dL — ABNORMAL HIGH (ref <150)
VLDL: 46 mg/dL — ABNORMAL HIGH (ref 0–40)

## 2023-05-09 MED ORDER — ENTRESTO 97-103 MG PO TABS: 1.0000 | ORAL_TABLET | Freq: Two times a day (BID) | ORAL | 3 refills | Status: AC

## 2023-05-09 MED ORDER — ATORVASTATIN CALCIUM 80 MG PO TABS: 80.0000 mg | ORAL_TABLET | Freq: Every day | ORAL | 3 refills | Status: AC

## 2023-05-09 MED ORDER — CARVEDILOL 12.5 MG PO TABS: ORAL_TABLET | ORAL | 3 refills | Status: AC

## 2023-05-09 MED ORDER — SPIRONOLACTONE 25 MG PO TABS: 12.5000 mg | ORAL_TABLET | Freq: Every day | ORAL | 3 refills | Status: AC

## 2023-05-09 NOTE — Patient Instructions (Signed)
 There has been no changes to your medications.  Labs done today, your results will be available in MyChart, we will contact you for abnormal readings.  Your physician recommends that you schedule a follow-up appointment in: 1 year ( April 2026) ** PLEASE CALL THE OFFICE IN FEBRUARY 2026 TO ARRANGE YOUR FOLLOW UP APPOINTMENT.**  If you have any questions or concerns before your next appointment please send us  a message through [place] or call our office at [phone].    TO LEAVE A MESSAGE FOR THE NURSE SELECT OPTION 2, PLEASE LEAVE A MESSAGE INCLUDING: YOUR NAME DATE OF BIRTH CALL BACK NUMBER REASON FOR CALL**this is important as we prioritize the call backs  YOU WILL RECEIVE A CALL BACK THE SAME DAY AS LONG AS YOU CALL BEFORE 4:00 PM  At the [facility], you and your health needs are our priority. As part of our continuing mission to provide you with exceptional heart care, we have created designated Provider Care Teams. These Care Teams include your primary Cardiologist (physician) and Advanced Practice Providers (APPs- Physician Assistants and Nurse Practitioners) who all work together to provide you with the care you need, when you need it.   You may see any of the following providers on your designated Care Team at your next follow up: Dr Jules Oar Dr Peder Bourdon Dr. Alwin Baars Dr. Arta Lark Amy Marijane Shoulders, NP Ruddy Corral, [place] [facility] [place], [place] Dennise Fitz, NP [place] Lee, NP Shawnee Dellen, NP Luster Salters, PharmD Bevely Brush, PharmD   Please be sure to bring in all your medications bottles to every appointment.    Thank you for choosing [facility]

## 2023-05-10 NOTE — Progress Notes (Signed)
 Date:  05/10/2023   ID:  Carlos [name], DOB 1955/04/12, MRN [id number]   Provider location: [facility] Type of Visit: Established patient   PCP:  Street, Renford Cartwright, MD  Cardiologist:  Dr. Mitzie Anda  Chief complaint: CHF   History of Present Illness: Carlos [name] is a 68 y.o. male who had minimal medical care up until 12/16.  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  Around Thanksgiving of 2016, he developed a cough and exertional shortness of breath.  He went to an urgent care and had a CXR that was suggestive of PNA, also showed cardiomegaly.  He did not improve with antibiotics.  Dyspnea became progressively worse and he developed ankle edema.  At this point, he went to see Dr Audrea Blender in [place].  He was found to be hypertensive and was started on Coreg  and losartan.  Atorvastatin  was started for elevated lipids.  He was sent for an echo at the hospital in [place], [place] was 25-30%. He was therefore referred here.  Right and left heart cath showed minimal CAD, reasonably optimized filling pressures on Lasix .  Cardiac index was low but not markedly so.    No smoking since April 16, 2015 with presume throat cancer. Had biopsy of throat mass 04/17/15 at [facility] showing invasive poorly differentiated squamous cell carcinoma, with 1 lymph node positive. Had surgical removal in 04/2015. No chemo or radiation required.    Cardiac MRI was done in 8/17.  EF up to 63% with normal RV.  No late gadolinium enhancement.  Echo was done in 7/19, showing that EF remained normal at 60-65%.   Patient had acute appendicitis with gangrene in 10/23, had appendectomy.   Echo in 2/24 showed EF 60-65%, mild LVH, normal RV.    Patient returns for followup of recovered cardiomyopathy.  BP is controlled.  Weight up 4 lbs.  No exertional dyspnea or chest pain.  He is retired, enjoys  gardening.  No palpitations.  No orthopnea/PND.    ECG (personally reviewed): NSR, normal   Labs (12/16): K 4.5, creatinine 1.21, LFTs normal Labs (1/17): K 4.8, creatinine 1.13 Labs (2/17): K 5.1 => 4.4, creatinine 1.69 => 1.1 Labs (4/17): K 4.2, Creatinine 1.12 Labs (7/17): K 4.3, creatinine 1.28 Labs (8/17): LDL 58, HDL 34 Labs (9/17): K 4.7, creatinine 1.44 Labs (3/18): K 4.5, creatinine 1.19 Labs (4/19): LDL 71 Labs (6/19): K 4.8, creatinine 1.3 Labs (3/22): LDL 57 Labs (11/23): K 4.1, creatinine 1.04 Labs (1/24): LDL 45, K 4.5, creatinine 3.08   PMH: 1. Hyperlipidemia 2. HTN 3. Prior smoker.  4. Chronic systolic CHF: Nonischemic cardiomyopathy.  - Echo (12/16) with EF 25-30%, restrictive diastolic function, mildly enlarged RV with mildly decreased systolic function, moderate MR, moderate TR, PASP 67 mmHg.  - LHC/RHC (1/17): Mild, nonobstructive CAD; mean RA 7, PA 42/16 mean 26, mean PCWP 17, CI 2.14, PVR 2.52 WU. - Cardiac MRI (8/17): EF 63%, normal RV size and systolic function, no late gadolinium enhancement.  - Echo (7/19): Mild focal basal septal hypertrophy, EF 60-65%.  - Echo (12/24): EF 60-65%, mild LVH, normal RV.  5. Throat cancer: S/p surgery at [facility] in 2017.  6. Carotid stenosis: Carotid dopplers 7/17 with 40-59% RICA stenosis.  - Carotid dopplers (7/18): 40-59% BICA - Carotid dopplers (7/19): 40-59% RICA stenosis.  -  Carotid dopplers (8/21): 1-39% BICA stenosis.  - Carotid dopplers (10/22): Minimal stenosis.  - Carotid dopplers (2/24): Minimal disease.     Current Outpatient Medications  Medication Sig Dispense Refill   Multiple Vitamin (MULTIVITAMIN WITH MINERALS) TABS tablet Take 1 tablet by mouth daily.     atorvastatin  (LIPITOR) 80 MG tablet Take 1 tablet (80 mg total) by mouth daily. 90 tablet 3   carvedilol  (COREG ) 12.5 MG tablet TAKE 1 TABLET BY MOUTH TWICE DAILY WITH MEALS. 180 tablet 3   sacubitril -valsartan  (ENTRESTO ) 97-103 MG Take 1  tablet by mouth 2 (two) times daily. TAKE 1 TABLET BY MOUTH TWICE DAILY . 180 tablet 3   spironolactone  (ALDACTONE ) 25 MG tablet Take 0.5 tablets (12.5 mg total) by mouth daily. 45 tablet 3   No current facility-administered medications for this encounter.    Allergies:   Patient has no known allergies.   Social History:  The patient  reports that he quit smoking about 8 years ago. His smoking use included cigarettes. He has never used smokeless tobacco. He reports that he does not drink alcohol.   Family History:  The patient's family history includes Diabetes Mellitus II in his father; Heart attack in his father; Heart disease in an other family member; Stroke in his brother and father.   ROS:  Please see the history of present illness.   All other systems are personally reviewed and negative.   Exam:  BP 130/80   Pulse (!) 52   Wt 83 kg (183 lb)   SpO2 97%   BMI 31.41 kg/m  General: NAD Neck: No JVD, no thyromegaly or thyroid  nodule.  Lungs: Clear to auscultation bilaterally with normal respiratory effort. CV: Nondisplaced PMI.  Heart regular S1/S2, no S3/S4, no murmur.  No peripheral edema.  No carotid bruit.  Normal pedal pulses.  Abdomen: Soft, nontender, no hepatosplenomegaly, no distention.  Skin: Intact without lesions or rashes.  Neurologic: Alert and oriented x 3.  Psych: Normal affect. Extremities: No clubbing or cyanosis.  HEENT: Normal.   Recent Labs: 05/09/2023: BUN 12; Creatinine, Ser 1.26; Potassium 4.0; Sodium 137  Personally reviewed   Wt Readings from Last 3 Encounters:  05/09/23 83 kg (183 lb)  02/04/22 81.6 kg (179 lb 12.8 oz)  03/25/20 80.6 kg (177 lb 12.8 oz)      ASSESSMENT AND PLAN:  1. Chronic systolic CHF: Nonischemic cardiomyopathy, EF 25-30% on 12/16 echo.  LHC/RHC (1/17) showed mild nonobstructive CAD and near-normal filling pressures on Lasix .  Possible viral myocarditis: episode seemed to start with an infectious event (possible PNA).   Also, must consider hypertensive cardiomyopathy with possibly years of untreated hypertension.  However, he does not have much LVH on his ECG.  He was not a heavy drinker.  Cardiac MRI in 8/17 showed improvement in EF to 63% with no late gadolinium enhancement and echo in 7/19 showed EF 60-65%.  Echo in 2/24 showed stable EF 60-65%.  NYHA class I, euvolemic on exam.  - Continue Coreg  12.5 mg bid  - Continue Entresto  97/103 bid.    - He will continue spironolactone  12.5 mg daily.  - BMET today and will need BMET every 3 months on his current meds.   2. HTN: BP controlled on the above meds.    3. Hyperlipidemia: Continue atorvastatin  40 mg daily.  Check lipids today.  Goal LDL < 55 with vascular disease (carotid stenosis).  4. Smoking:  Congratulated on his continued abstinence.  5. Carotid stenosis: Improved, minimal  on last carotid dopplers in 2/24.  - Needs to continue statin and ASA 81 daily.   Followup 1 year.   I spent 21 minutes reviewing data, interviewing patient, and organizing the orders/followup.   Signed, Peder Bourdon, MD  05/10/2023   [facility] [address] [facility] [place] [address] [phone] (office) [phone] (fax)

## 2023-05-11 ENCOUNTER — Telehealth (HOSPITAL_COMMUNITY): Payer: Self-pay

## 2023-05-11 NOTE — Telephone Encounter (Signed)
Spoke with patient regarding the following results. Patient made aware and patient verbalized understanding.   Advised patient to call back to office with any issues, questions, or concerns. Patient verbalized understanding.

## 2023-05-11 NOTE — Telephone Encounter (Signed)
-----   Message from Peder Bourdon sent at 05/10/2023  4:28 PM EDT ----- Lipids acceptable.  Would like to see triglycerides lower, watch diet.

## 2023-06-20 DIAGNOSIS — C321 Malignant neoplasm of supraglottis: Secondary | ICD-10-CM | POA: Diagnosis not present

## 2023-07-05 DIAGNOSIS — Z131 Encounter for screening for diabetes mellitus: Secondary | ICD-10-CM | POA: Diagnosis not present

## 2023-07-05 DIAGNOSIS — C321 Malignant neoplasm of supraglottis: Secondary | ICD-10-CM | POA: Diagnosis not present

## 2023-07-05 DIAGNOSIS — E785 Hyperlipidemia, unspecified: Secondary | ICD-10-CM | POA: Diagnosis not present

## 2023-07-05 DIAGNOSIS — I5022 Chronic systolic (congestive) heart failure: Secondary | ICD-10-CM | POA: Diagnosis not present

## 2023-07-05 DIAGNOSIS — R911 Solitary pulmonary nodule: Secondary | ICD-10-CM | POA: Diagnosis not present

## 2023-07-05 DIAGNOSIS — I10 Essential (primary) hypertension: Secondary | ICD-10-CM | POA: Diagnosis not present

## 2023-07-05 DIAGNOSIS — Z125 Encounter for screening for malignant neoplasm of prostate: Secondary | ICD-10-CM | POA: Diagnosis not present

## 2023-07-05 DIAGNOSIS — I272 Pulmonary hypertension, unspecified: Secondary | ICD-10-CM | POA: Diagnosis not present

## 2023-07-05 DIAGNOSIS — Z79899 Other long term (current) drug therapy: Secondary | ICD-10-CM | POA: Diagnosis not present

## 2023-07-13 ENCOUNTER — Other Ambulatory Visit (HOSPITAL_COMMUNITY): Payer: Self-pay | Admitting: Cardiology
